# Patient Record
Sex: Female | Born: 1960 | Race: Black or African American | Hispanic: No | Marital: Single | Smoking: Never smoker
Health system: Southern US, Community
[De-identification: ages and names within clinical notes are randomized; demographics above are authoritative.]

## PROBLEM LIST (undated history)

## (undated) DIAGNOSIS — E079 Disorder of thyroid, unspecified: Secondary | ICD-10-CM

## (undated) DIAGNOSIS — I10 Essential (primary) hypertension: Secondary | ICD-10-CM

## (undated) HISTORY — DX: Disorder of thyroid, unspecified: E07.9

## (undated) HISTORY — DX: Essential (primary) hypertension: I10

## (undated) HISTORY — PX: TONSILLECTOMY: SUR1361

## (undated) HISTORY — PX: TOTAL ABDOMINAL HYSTERECTOMY: SHX209

## (undated) HISTORY — PX: ABDOMINAL HYSTERECTOMY: SHX81

---

## 2019-01-22 DIAGNOSIS — E039 Hypothyroidism, unspecified: Secondary | ICD-10-CM | POA: Insufficient documentation

## 2019-01-22 DIAGNOSIS — G4733 Obstructive sleep apnea (adult) (pediatric): Secondary | ICD-10-CM | POA: Insufficient documentation

## 2019-01-22 DIAGNOSIS — E7800 Pure hypercholesterolemia, unspecified: Secondary | ICD-10-CM | POA: Insufficient documentation

## 2019-01-22 DIAGNOSIS — Z86010 Personal history of colonic polyps: Secondary | ICD-10-CM | POA: Insufficient documentation

## 2019-01-22 DIAGNOSIS — R9230 Dense breasts, unspecified: Secondary | ICD-10-CM | POA: Insufficient documentation

## 2019-01-22 DIAGNOSIS — E669 Obesity, unspecified: Secondary | ICD-10-CM | POA: Insufficient documentation

## 2019-01-22 DIAGNOSIS — R600 Localized edema: Secondary | ICD-10-CM | POA: Insufficient documentation

## 2019-01-22 DIAGNOSIS — K5730 Diverticulosis of large intestine without perforation or abscess without bleeding: Secondary | ICD-10-CM | POA: Insufficient documentation

## 2019-01-22 DIAGNOSIS — D72819 Decreased white blood cell count, unspecified: Secondary | ICD-10-CM | POA: Insufficient documentation

## 2019-01-22 DIAGNOSIS — Z860101 Personal history of adenomatous and serrated colon polyps: Secondary | ICD-10-CM | POA: Insufficient documentation

## 2019-01-22 DIAGNOSIS — N951 Menopausal and female climacteric states: Secondary | ICD-10-CM | POA: Insufficient documentation

## 2019-01-22 DIAGNOSIS — I10 Essential (primary) hypertension: Secondary | ICD-10-CM | POA: Insufficient documentation

## 2020-03-26 ENCOUNTER — Other Ambulatory Visit: Payer: Self-pay

## 2020-03-26 DIAGNOSIS — Z20822 Contact with and (suspected) exposure to covid-19: Secondary | ICD-10-CM

## 2020-03-29 LAB — NOVEL CORONAVIRUS, NAA: SARS-CoV-2, NAA: DETECTED — AB

## 2021-03-28 ENCOUNTER — Other Ambulatory Visit: Payer: Self-pay | Admitting: Family Medicine

## 2021-03-28 DIAGNOSIS — Z1231 Encounter for screening mammogram for malignant neoplasm of breast: Secondary | ICD-10-CM

## 2021-04-16 ENCOUNTER — Ambulatory Visit
Admission: RE | Admit: 2021-04-16 | Discharge: 2021-04-16 | Disposition: A | Discharge status: Home / Self Care | Payer: 59 | Source: Ambulatory Visit | Attending: Family Medicine | Admitting: Family Medicine

## 2021-04-16 ENCOUNTER — Other Ambulatory Visit: Payer: Self-pay

## 2021-04-16 ENCOUNTER — Ambulatory Visit: Payer: Self-pay

## 2021-04-16 DIAGNOSIS — Z1231 Encounter for screening mammogram for malignant neoplasm of breast: Secondary | ICD-10-CM

## 2021-08-31 DIAGNOSIS — H0288B Meibomian gland dysfunction left eye, upper and lower eyelids: Secondary | ICD-10-CM | POA: Diagnosis not present

## 2021-08-31 DIAGNOSIS — H04123 Dry eye syndrome of bilateral lacrimal glands: Secondary | ICD-10-CM | POA: Diagnosis not present

## 2021-08-31 DIAGNOSIS — H40013 Open angle with borderline findings, low risk, bilateral: Secondary | ICD-10-CM | POA: Diagnosis not present

## 2021-08-31 DIAGNOSIS — H0288A Meibomian gland dysfunction right eye, upper and lower eyelids: Secondary | ICD-10-CM | POA: Diagnosis not present

## 2021-09-27 DIAGNOSIS — E039 Hypothyroidism, unspecified: Secondary | ICD-10-CM | POA: Diagnosis not present

## 2021-09-27 DIAGNOSIS — E669 Obesity, unspecified: Secondary | ICD-10-CM | POA: Diagnosis not present

## 2021-09-27 DIAGNOSIS — I10 Essential (primary) hypertension: Secondary | ICD-10-CM | POA: Diagnosis not present

## 2021-09-27 DIAGNOSIS — Z0000 Encounter for general adult medical examination without abnormal findings: Secondary | ICD-10-CM | POA: Diagnosis not present

## 2021-09-27 DIAGNOSIS — N951 Menopausal and female climacteric states: Secondary | ICD-10-CM | POA: Diagnosis not present

## 2021-10-19 DIAGNOSIS — I10 Essential (primary) hypertension: Secondary | ICD-10-CM | POA: Diagnosis not present

## 2021-10-19 DIAGNOSIS — E559 Vitamin D deficiency, unspecified: Secondary | ICD-10-CM | POA: Diagnosis not present

## 2021-10-19 DIAGNOSIS — E782 Mixed hyperlipidemia: Secondary | ICD-10-CM | POA: Diagnosis not present

## 2021-10-19 DIAGNOSIS — E039 Hypothyroidism, unspecified: Secondary | ICD-10-CM | POA: Diagnosis not present

## 2021-12-27 DIAGNOSIS — Z789 Other specified health status: Secondary | ICD-10-CM | POA: Diagnosis not present

## 2022-01-17 ENCOUNTER — Ambulatory Visit: Payer: 59 | Admitting: Family Medicine

## 2022-04-14 DIAGNOSIS — I10 Essential (primary) hypertension: Secondary | ICD-10-CM | POA: Diagnosis not present

## 2022-04-14 DIAGNOSIS — M5412 Radiculopathy, cervical region: Secondary | ICD-10-CM | POA: Diagnosis not present

## 2022-04-14 DIAGNOSIS — R0789 Other chest pain: Secondary | ICD-10-CM | POA: Diagnosis not present

## 2022-04-14 DIAGNOSIS — Z79899 Other long term (current) drug therapy: Secondary | ICD-10-CM | POA: Diagnosis not present

## 2022-04-14 DIAGNOSIS — M47812 Spondylosis without myelopathy or radiculopathy, cervical region: Secondary | ICD-10-CM | POA: Diagnosis not present

## 2022-04-14 DIAGNOSIS — R9431 Abnormal electrocardiogram [ECG] [EKG]: Secondary | ICD-10-CM | POA: Diagnosis not present

## 2022-04-14 DIAGNOSIS — R079 Chest pain, unspecified: Secondary | ICD-10-CM | POA: Diagnosis not present

## 2022-04-14 DIAGNOSIS — M4802 Spinal stenosis, cervical region: Secondary | ICD-10-CM | POA: Diagnosis not present

## 2022-04-14 DIAGNOSIS — R0602 Shortness of breath: Secondary | ICD-10-CM | POA: Diagnosis not present

## 2022-06-02 NOTE — Progress Notes (Signed)
Established patient visit   Patient: Mckenzie Mckinney   DOB: 1960-12-13   62 y.o. Female  MRN: [id number] Visit Date: 06/03/2022  Today's healthcare provider: Owens Loffler, DO   Chief Complaint  Patient presents with   [place]    Chief Complaint  Patient presents with   Establish Care   HPI  Pt presents for new patient visit.  She has multiple things she wants to discuss today Patient said in February she went to the ED for chest pain which she thinks may have been indigestion related.  She had a full workup including EKG and x-ray that was normal.  However, patient says that now she gets winded walking to and from the parking garage when she did not get winded in the past.  This has been ongoing for some months now.  She did have COVID in 2022.  She denies any recent weight gain.  She denies any wheezing or underlying lung disease to her knowledge.  She does say she has a sister that has CHF.  She also admits to swollen ankles when she wakes up.  Patient would like to talk about menopause.  She is getting hot flashes that are 10+ a day.  She did have a hysterectomy in 1989 but still has her ovaries.  Weight management Patient would like to discuss weight today.  She is overweight and says that she has been having trouble with diet and exercise.  She would like to do something today  Review of Systems  Constitutional:  Negative for activity change, fatigue and fever.  Respiratory:  Negative for cough and shortness of breath.   Cardiovascular:  Negative for chest pain.  Gastrointestinal:  Negative for abdominal pain.  Genitourinary:  Negative for difficulty urinating.       Current Meds  Medication Sig   cholecalciferol (VITAMIN D3) 25 MCG (1000 UNIT) tablet Take 1,000 Units by mouth daily.   levothyroxine (SYNTHROID) 100 MCG tablet Take 100 mcg by mouth. Daily T, W, F, Sat, Sun   levothyroxine (SYNTHROID) 88 MCG tablet Take 88 mcg by mouth. Mon,  Thurs   valsartan-hydrochlorothiazide (DIOVAN-HCT) 320-25 MG tablet Take 1 tablet by mouth daily.    OBJECTIVE    BP 106/70   Pulse 62   Ht 5' (1.524 m)   Wt 189 lb (85.7 kg)   SpO2 100%   BMI 36.91 kg/m   Physical Exam Vitals and nursing note reviewed.  Constitutional:      General: She is not in acute distress.    Appearance: Normal appearance.  HENT:     Head: Normocephalic and atraumatic.     Right Ear: External ear normal.     Left Ear: External ear normal.     Nose: Nose normal.  Eyes:     Conjunctiva/sclera: Conjunctivae normal.  Cardiovascular:     Rate and Rhythm: Normal rate and regular rhythm.  Pulmonary:     Effort: Pulmonary effort is normal.     Breath sounds: Normal breath sounds.  Neurological:     General: No focal deficit present.     Mental Status: She is alert and oriented to person, place, and time.  Psychiatric:        Mood and Affect: Mood normal.        Behavior: Behavior normal.        Thought Content: Thought content normal.        Judgment: Judgment normal.  ASSESSMENT & PLAN    Problem List Items Addressed This Visit       Other   Dyspnea - Primary    62 year old female presents to establish care and has concerns of dyspnea.  Have ordered pulmonary function test to see if she has any underlying lung disease.  I have also ordered echocardiogram to see if this is cardiac in nature.  With the swollen ankles and dyspnea I would like to rule out heart failure. -Ordering CBC to check for signs of anemia and CMP to check for any electrolyte abnormalities that could be the cause.      Relevant Orders   Pulmonary function test   ECHOCARDIOGRAM COMPLETE   CBC   COMPLETE METABOLIC PANEL WITH GFR   Class 2 obesity due to excess calories without serious comorbidity with body mass index (BMI) of 36.0 to 36.9 in adult    - Had a long discussion with patient about diet and exercise and we created a plan where she can start  incorporating exercise into her daily life.  She has decided she will walk 3 times a week.  She is also doing a whole hoop class this weekend to see if she likes that as well.  We discussed needing to enjoy exercise for her health -We will go ahead with lifestyle modifications for 3 months, and then I would like to discuss medication.  -Would like to get TSH, lipid panel      Relevant Orders   TSH + free T4   Lipid panel   Menopause    Have referred patient to gynecology to further discuss and she did have a hysterectomy and still has her ovaries so they can formulate a plan about possible hormone replacement therapy versus other medications.      Relevant Orders   Ambulatory referral to Obstetrics / Gynecology   Screening for lipid disorders   Relevant Orders   Lipid panel    Return in about 3 months (around 09/03/2022).      No orders of the defined types were placed in this encounter.   Orders Placed This Encounter  Procedures   TSH + free T4   Lipid panel   CBC   COMPLETE METABOLIC PANEL WITH GFR   Ambulatory referral to Obstetrics / Gynecology    Referral Priority:   Routine    Referral Type:   Consultation    Referral Reason:   Specialty Services Required    Requested Specialty:   Obstetrics and Gynecology   ECHOCARDIOGRAM COMPLETE    Standing Status:   Future    Standing Expiration Date:   06/03/2023    Order Specific Question:   Where should this test be performed    Answer:   [facility]    Order Specific Question:   Perflutren DEFINITY (image enhancing agent) should be administered unless hypersensitivity or allergy exist    Answer:   Administer Perflutren    Order Specific Question:   Reason for exam-Echo    Answer:   Dyspnea  R06.00   Pulmonary function test    Standing Status:   Future    Standing Expiration Date:   06/03/2023    Order Specific Question:   Where should this test be performed?    Answer:   Izell Bufalo, DO  [facility]  Health Primary Care & Sports Medicine at [facility] [phone] (phone) [phone] (fax)  [place]

## 2022-06-03 ENCOUNTER — Encounter: Payer: Self-pay | Admitting: Family Medicine

## 2022-06-03 ENCOUNTER — Ambulatory Visit: Payer: BC Managed Care – PPO | Admitting: Family Medicine

## 2022-06-03 VITALS — BP 106/70 | HR 62 | Ht 60.0 in | Wt 189.0 lb

## 2022-06-03 DIAGNOSIS — R0600 Dyspnea, unspecified: Secondary | ICD-10-CM

## 2022-06-03 DIAGNOSIS — Z780 Asymptomatic menopausal state: Secondary | ICD-10-CM | POA: Diagnosis not present

## 2022-06-03 DIAGNOSIS — Z6836 Body mass index (BMI) 36.0-36.9, adult: Secondary | ICD-10-CM

## 2022-06-03 DIAGNOSIS — Z13220 Encounter for screening for lipoid disorders: Secondary | ICD-10-CM

## 2022-06-03 DIAGNOSIS — E6609 Other obesity due to excess calories: Secondary | ICD-10-CM

## 2022-06-03 NOTE — Assessment & Plan Note (Signed)
Have referred patient to gynecology to further discuss and she did have a hysterectomy and still has her ovaries so they can formulate a plan about possible hormone replacement therapy versus other medications.

## 2022-06-03 NOTE — Assessment & Plan Note (Addendum)
-   Had a long discussion with patient about diet and exercise and we created a plan where she can start incorporating exercise into her daily life.  She has decided she will walk 3 times a week.  She is also doing a whole hoop class this weekend to see if she likes that as well.  We discussed needing to enjoy exercise for her health -We will go ahead with lifestyle modifications for 3 months, and then I would like to discuss medication.  -Would like to get TSH, lipid panel

## 2022-06-03 NOTE — Assessment & Plan Note (Addendum)
62 year old female presents to establish care and has concerns of dyspnea.  Have ordered pulmonary function test to see if she has any underlying lung disease.  I have also ordered echocardiogram to see if this is cardiac in nature.  With the swollen ankles and dyspnea I would like to rule out heart failure. -Ordering CBC to check for signs of anemia and CMP to check for any electrolyte abnormalities that could be the cause.

## 2022-06-04 LAB — COMPLETE METABOLIC PANEL WITH GFR
AG Ratio: 2.2 (calc) (ref 1.0–2.5)
ALT: 13 U/L (ref 6–29)
AST: 16 U/L (ref 10–35)
Albumin: 5.1 g/dL (ref 3.6–5.1)
Alkaline phosphatase (APISO): 71 U/L (ref 37–153)
BUN: 18 mg/dL (ref 7–25)
CO2: 26 mmol/L (ref 20–32)
Calcium: 10.2 mg/dL (ref 8.6–10.4)
Chloride: 105 mmol/L (ref 98–110)
Creat: 0.97 mg/dL (ref 0.50–1.05)
Globulin: 2.3 g/dL (calc) (ref 1.9–3.7)
Glucose, Bld: 87 mg/dL (ref 65–99)
Potassium: 3.8 mmol/L (ref 3.5–5.3)
Sodium: 142 mmol/L (ref 135–146)
Total Bilirubin: 0.5 mg/dL (ref 0.2–1.2)
Total Protein: 7.4 g/dL (ref 6.1–8.1)
eGFR: 67 mL/min/{1.73_m2} (ref >=60)

## 2022-06-04 LAB — CBC
HCT: 43.4 % (ref 35.0–45.0)
Hemoglobin: 14.8 g/dL (ref 11.7–15.5)
MCH: 29 pg (ref 27.0–33.0)
MCHC: 34.1 g/dL (ref 32.0–36.0)
MCV: 84.9 fL (ref 80.0–100.0)
MPV: 11.8 fL (ref 7.5–12.5)
Platelets: 227 10*3/uL (ref 140–400)
RBC: 5.11 10*6/uL — ABNORMAL HIGH (ref 3.80–5.10)
RDW: 12.9 % (ref 11.0–15.0)
WBC: 3.6 10*3/uL — ABNORMAL LOW (ref 3.8–10.8)

## 2022-06-04 LAB — LIPID PANEL
Cholesterol: 214 mg/dL — ABNORMAL HIGH (ref <200)
HDL: 65 mg/dL (ref >=50)
LDL Cholesterol (Calc): 130 mg/dL (calc) — ABNORMAL HIGH
Non-HDL Cholesterol (Calc): 149 mg/dL (calc) — ABNORMAL HIGH (ref <130)
Total CHOL/HDL Ratio: 3.3 (calc) (ref <5.0)
Triglycerides: 91 mg/dL (ref <150)

## 2022-06-04 LAB — TSH+FREE T4: TSH W/REFLEX TO FT4: 2.1 mIU/L (ref 0.40–4.50)

## 2022-06-13 ENCOUNTER — Telehealth (HOSPITAL_BASED_OUTPATIENT_CLINIC_OR_DEPARTMENT_OTHER): Payer: Self-pay | Admitting: *Deleted

## 2022-06-13 NOTE — Telephone Encounter (Signed)
Left message for patient to call and schedule the Echocardiogram ordered by Dr. Mel Almond

## 2022-06-20 ENCOUNTER — Other Ambulatory Visit: Payer: Self-pay | Admitting: Family Medicine

## 2022-07-06 ENCOUNTER — Encounter: Payer: Self-pay | Admitting: Obstetrics and Gynecology

## 2022-07-06 ENCOUNTER — Ambulatory Visit: Payer: BC Managed Care – PPO | Admitting: Obstetrics and Gynecology

## 2022-07-06 VITALS — BP 111/64 | HR 64 | Ht 60.0 in | Wt 190.0 lb

## 2022-07-06 DIAGNOSIS — N951 Menopausal and female climacteric states: Secondary | ICD-10-CM

## 2022-07-06 NOTE — Progress Notes (Signed)
NEW GYNECOLOGY VISIT  Subjective:  Mckenzie Mckinney is a 62 y.o. G2P2 with history of hysterectomy (~1990) for AUB presenting for discussion of hot flashes.   Reports severe hot flashes occurring multiple times per day for the past 20 years. Is able to sleep. Would like to know more about non-estrogen options for treatment. Has hx HTN, sister w/ breast ca  PapHx: Hysterectomy for benign indications > 30 years ago, paps not indicated  Past Medical History:  Diagnosis Date   Hypertension    Thyroid disease    Past Surgical History:  Procedure Laterality Date   ABDOMINAL HYSTERECTOMY     CESAREAN SECTION     x 2   TONSILLECTOMY     Current Outpatient Medications on File Prior to Visit  Medication Sig Dispense Refill   cholecalciferol (VITAMIN D3) 25 MCG (1000 UNIT) tablet Take 1,000 Units by mouth daily.     levothyroxine (SYNTHROID) 100 MCG tablet Take 100 mcg by mouth. Daily T, W, F, Sat, Sun     levothyroxine (SYNTHROID) 88 MCG tablet Take 88 mcg by mouth. Mon, Thurs     valsartan-hydrochlorothiazide (DIOVAN-HCT) 320-25 MG tablet TAKE 1 TABLET BY MOUTH DAILY 90 tablet 0   No current facility-administered medications on file prior to visit.   No Known Allergies OB History     Gravida  2   Para  2   Term  2   Preterm      AB      Living  2      SAB      IAB      Ectopic      Multiple      Live Births             Social History   Socioeconomic History   Marital status: Single    Spouse name: Not on file   Number of children: Not on file   Years of education: Not on file   Highest education level: Not on file  Occupational History   Not on file  Tobacco Use   Smoking status: Never   Smokeless tobacco: Never  Vaping Use   Vaping Use: Never used  Substance and Sexual Activity   Alcohol use: Not on file   Drug use: Never   Sexual activity: Not Currently    Partners: Male    Birth control/protection: Abstinence  Other Topics Concern    Not on file  Social History Narrative   Not on file   Social Determinants of Health   Financial Resource Strain: Not on file  Food Insecurity: Not on file  Transportation Needs: Not on file  Physical Activity: Not on file  Stress: Not on file  Social Connections: Not on file  Intimate Partner Violence: Not on file    Objective:   Vitals:   07/06/22 1525  BP: 111/64  Pulse: 64  Weight: 190 lb (86.2 kg)  Height: 5' (1.524 m)    General:  Alert, oriented and cooperative. Patient is in no acute distress.  Skin: Skin is warm and dry. No rash noted.   Cardiovascular: Normal heart rate noted  Respiratory: Normal respiratory effort, no problems with respiration noted  Abdomen: Soft, non-tender, non-distended   Pelvic: NEFG.   Exam performed in the presence of a chaperone  Assessment and Plan:  Mckenzie Mckinney is a 62 y.o. with hot flashes due to menopause and desire for non-hormonal treatment.   Hot flashes due to menopause -  We discussed that hormone therapy is the most efficacious treatment for hot flashes, but carries risk. - We discussed that risk appears to be highest in women aged 60+ and in women who start hormone therapy several years after menopause. It is difficult to know her distance from menopause (hx hyst), but if we assume average age, she would be ~9 years from menopause and fall into this higher risk category - In light of these risks, it is understandable that she would like to avoid estrogen based therapies.  - We discussed non-hormonal options with first line being SSRIs/SNRIs  -- Reviewed that it can take a few days to weeks to see the effect  -- Paxil (paroxetine) is the only FDA-approved anti-depressant for the treatment of hot flashes. There is a low dose option (7.5mg /d), but insurance coverage is limited.  -- Citalopram, escitalopram, venlafaxine, and desvenlafaxine are other options though venlafaxine & desvenlafaxine tend to have more side effects  (nausea/vomiting) -- Gabapentin is an option for women who don't tolerate SSRI/SNRI's and seem to help women whose hot flashes are worst at night. Side effects include headache, dizziness, and drowsiness  -- Fezolinetant (Veoz[place]) - neurokinin 3 receptor (NK3R) antagonist that works at the level of the hypothalamus. This is not an antidepressant. There can be rare liver issue related to the medicine, so the FDA recommends getting liver enzymes before starting treatment and every 3 months for the first 9 months of treatment.  - We discussed the possibility of using a progesterone-only method for treatment including aygestin  daily or micronized progesterone  daily. There are no long term studies that look at safety of long term use of progesterone-only treatments for hot flashes.  After discussion, [name] would like to consider her options.   I spent a total of 45 minutes reviewing the patient's chart, interviewing & counseling the patient and documenting our encounter.   Future Appointments  Date Time Provider Department Center  07/12/2022  2:00 PM [facility]VAS [facility]  08/03/2022  4:00 PM Charlton Amor, DO [facility] None   Lennart Pall, MD

## 2022-07-12 ENCOUNTER — Other Ambulatory Visit: Payer: Self-pay | Admitting: Family Medicine

## 2022-07-12 ENCOUNTER — Ambulatory Visit (INDEPENDENT_AMBULATORY_CARE_PROVIDER_SITE_OTHER): Payer: BC Managed Care – PPO

## 2022-07-12 DIAGNOSIS — R0600 Dyspnea, unspecified: Secondary | ICD-10-CM

## 2022-07-12 LAB — ECHOCARDIOGRAM COMPLETE
AR max vel: 1.58 cm2
AV Area VTI: 1.68 cm2
AV Area mean vel: 1.57 cm2
AV Mean grad: 6 mmHg
AV Peak grad: 12.4 mmHg
Ao pk vel: 1.76 m/s
Area-P 1/2: 3.31 cm2
P 1/2 time: 917 msec
S' Lateral: 1.97 cm

## 2022-07-26 ENCOUNTER — Other Ambulatory Visit: Payer: Self-pay

## 2022-07-26 MED ORDER — LEVOTHYROXINE SODIUM 100 MCG PO TABS
100.0000 ug | ORAL_TABLET | ORAL | 3 refills | Status: DC
  Filled 2022-07-26: qty 30, fill #0

## 2022-08-03 ENCOUNTER — Encounter: Payer: Self-pay | Admitting: Family Medicine

## 2022-08-03 ENCOUNTER — Ambulatory Visit: Payer: BC Managed Care – PPO | Admitting: Family Medicine

## 2022-08-03 VITALS — BP 115/75 | HR 63 | Resp 20 | Ht 60.0 in | Wt 188.3 lb

## 2022-08-03 DIAGNOSIS — E039 Hypothyroidism, unspecified: Secondary | ICD-10-CM | POA: Diagnosis not present

## 2022-08-03 DIAGNOSIS — R0600 Dyspnea, unspecified: Secondary | ICD-10-CM | POA: Diagnosis not present

## 2022-08-03 NOTE — Patient Instructions (Signed)
Stop levothyroxine   Start taking levothyroxine  Follow up in 6 weeks

## 2022-08-03 NOTE — Progress Notes (Signed)
Established patient visit   Patient: Mckenzie Mckinney   DOB: [birth year]   61 y.o. Female  MRN: [id number] Visit Date: 08/03/2022  Today's healthcare provider: Charlton Amor, DO   Chief Complaint  Patient presents with   Follow-up   Shortness of Breath    SUBJECTIVE    Chief Complaint  Patient presents with   Follow-up   Shortness of Breath   HPI  Pt presents for follow up on dyspnea. At last visit, echo and PFT were ordered. Echo was normal. She has been walking more recently and has lost a few pounds. She has noted a decrease in her dyspnea.   Levothyroxine She is taking levothyroxine two days a week and then the rest of the days of the week.  Review of Systems  Constitutional:  Negative for activity change, fatigue and fever.  Respiratory:  Negative for cough and shortness of breath.   Cardiovascular:  Negative for chest pain.  Gastrointestinal:  Negative for abdominal pain.  Genitourinary:  Negative for difficulty urinating.       Current Meds  Medication Sig   cholecalciferol (VITAMIN D3) 25 MCG (1000 UNIT) tablet Take 1,000 Units by mouth daily.   levothyroxine (SYNTHROID) 100 MCG tablet Take 1 tablet (100 mcg total) by mouth on Tuesday, Wednesday, Friday, Saturday and Sunday.   levothyroxine (SYNTHROID) 88 MCG tablet Take 88 mcg by mouth. Mon, Thurs   valsartan-hydrochlorothiazide (DIOVAN-HCT) 320-25 MG tablet TAKE 1 TABLET BY MOUTH DAILY    OBJECTIVE    BP 115/75 (BP Location: Right Arm, Cuff Size: Normal)   Pulse 63   Resp 20   Ht 5' (1.524 m)   Wt 188 lb 4.8 oz (85.4 kg)   SpO2 99%   BMI 36.77 kg/m   Physical Exam Vitals and nursing note reviewed.  Constitutional:      General: She is not in acute distress.    Appearance: Normal appearance.  HENT:     Head: Normocephalic and atraumatic.     Right Ear: External ear normal.     Left Ear: External ear normal.     Nose: Nose normal.  Eyes:     Conjunctiva/sclera: Conjunctivae  normal.  Cardiovascular:     Rate and Rhythm: Normal rate and regular rhythm.  Pulmonary:     Effort: Pulmonary effort is normal.     Breath sounds: Normal breath sounds.  Neurological:     General: No focal deficit present.     Mental Status: She is alert and oriented to person, place, and time.  Psychiatric:        Mood and Affect: Mood normal.        Behavior: Behavior normal.        Thought Content: Thought content normal.        Judgment: Judgment normal.          ASSESSMENT & PLAN    Problem List Items Addressed This Visit       Endocrine   Hypothyroidism - Primary    - Pt on levothyroxine and . Discussed taking for 6 weeks and then getting a TSH         Other   Dyspnea    - resolved, continue diet and exercise       At next visit will get TSH level and dose adjust as needed Will also get vit d levels   Return in about 6 weeks (around 09/14/2022).  No orders of the defined types were placed in this encounter.   No orders of the defined types were placed in this encounter.    Charlton Amor, DO  [facility] Primary Care & Sports Medicine at [facility] [phone] (phone) [phone] (fax)  [facility]

## 2022-08-03 NOTE — Assessment & Plan Note (Signed)
-   Pt on levothyroxine and . Discussed taking for 6 weeks and then getting a TSH

## 2022-08-03 NOTE — Assessment & Plan Note (Signed)
-   resolved, continue diet and exercise

## 2022-08-25 ENCOUNTER — Other Ambulatory Visit: Payer: Self-pay | Admitting: Family Medicine

## 2022-08-25 MED ORDER — LEVOTHYROXINE SODIUM 88 MCG PO TABS: 88.0000 ug | ORAL_TABLET | Freq: Every day | ORAL | 0 refills | Status: DC

## 2022-08-25 NOTE — Telephone Encounter (Signed)
Patient called requesting a refill of levothyroxine (SYNTHROID) 88 MCG tablet [[id number]]   Pharmacy told the patient they need clarification on whether she is taking the 100 mcg or the 88 mcg before it can be refilled.  Pharmacy -  [facility] [phone] [place], [place] - [address] AT [facility] [address]

## 2022-08-25 NOTE — Telephone Encounter (Signed)
Spoke with [employer] and was told that she would need a new prescription for the to be taken daily- the current prescription is from a previous prescriber and only written to take on Monday and Tuesday. Pended the script for daily administration.

## 2022-08-29 ENCOUNTER — Other Ambulatory Visit: Payer: Self-pay

## 2022-08-29 MED ORDER — LEVOTHYROXINE SODIUM 88 MCG PO TABS: 88.0000 ug | ORAL_TABLET | Freq: Every day | ORAL | 0 refills | Status: DC

## 2022-09-14 ENCOUNTER — Encounter: Payer: Self-pay | Admitting: Family Medicine

## 2022-09-14 ENCOUNTER — Ambulatory Visit: Payer: BC Managed Care – PPO | Admitting: Family Medicine

## 2022-09-14 VITALS — BP 98/66 | HR 66 | Ht 60.0 in | Wt 191.0 lb

## 2022-09-14 DIAGNOSIS — E039 Hypothyroidism, unspecified: Secondary | ICD-10-CM | POA: Diagnosis not present

## 2022-09-14 DIAGNOSIS — I10 Essential (primary) hypertension: Secondary | ICD-10-CM

## 2022-09-14 NOTE — Assessment & Plan Note (Signed)
-   follow up TSH today to make sure is adequate dosing

## 2022-09-14 NOTE — Assessment & Plan Note (Signed)
Bp hypotensive today 98/66 will stop htn meds and follow up in 2 weeks

## 2022-09-14 NOTE — Patient Instructions (Signed)
Stop blood pressure medicine   Follow up in 2 weeks for nurse visit

## 2022-09-14 NOTE — Progress Notes (Signed)
Established patient visit   Patient: Mckenzie Mckinney   DOB: April 26, 1960   61 y.o. Female  MRN: [id number] Visit Date: 09/14/2022  Today's healthcare provider: Charlton Amor, DO   Chief Complaint  Patient presents with   Hypothyroidism    Follow up.    SUBJECTIVE    Chief Complaint  Patient presents with   Hypothyroidism    Follow up.   HPI HPI     Hypothyroidism    Additional comments: Follow up.      Last edited by Carren Rang, CMA on 09/14/2022  3:33 PM.      Pt presents for follow up on levothyroxine . She is here for repeat for TSH levels.  Review of Systems  Constitutional:  Negative for activity change, fatigue and fever.  Respiratory:  Negative for cough and shortness of breath.   Cardiovascular:  Negative for chest pain.  Gastrointestinal:  Negative for abdominal pain.  Genitourinary:  Negative for difficulty urinating.       Current Meds  Medication Sig   cholecalciferol (VITAMIN D3) 25 MCG (1000 UNIT) tablet Take 1,000 Units by mouth daily.   levothyroxine (SYNTHROID) 88 MCG tablet Take 1 tablet (88 mcg total) by mouth daily.   [DISCONTINUED] valsartan-hydrochlorothiazide (DIOVAN-HCT) 320-25 MG tablet TAKE 1 TABLET BY MOUTH DAILY    OBJECTIVE    BP 98/66   Pulse 66   Ht 5' (1.524 m)   Wt 191 lb (86.6 kg)   SpO2 98%   BMI 37.30 kg/m   Physical Exam Vitals and nursing note reviewed.  Constitutional:      General: She is not in acute distress.    Appearance: Normal appearance.  HENT:     Head: Normocephalic and atraumatic.     Right Ear: External ear normal.     Left Ear: External ear normal.     Nose: Nose normal.  Eyes:     Conjunctiva/sclera: Conjunctivae normal.  Cardiovascular:     Rate and Rhythm: Normal rate and regular rhythm.  Pulmonary:     Effort: Pulmonary effort is normal.     Breath sounds: Normal breath sounds.  Neurological:     General: No focal deficit present.     Mental Status: She is alert and  oriented to person, place, and time.  Psychiatric:        Mood and Affect: Mood normal.        Behavior: Behavior normal.        Thought Content: Thought content normal.        Judgment: Judgment normal.          ASSESSMENT & PLAN    Problem List Items Addressed This Visit       Cardiovascular and Mediastinum   Essential hypertension    Bp hypotensive today 98/66 will stop htn meds and follow up in 2 weeks        Endocrine   Hypothyroidism - Primary    - follow up TSH today to make sure is adequate dosing      Relevant Orders   TSH    Return in about 2 weeks (around 09/28/2022) for htn check nurse visit.      No orders of the defined types were placed in this encounter.   Orders Placed This Encounter  Procedures   TSH     Charlton Amor, DO  [facility] Primary Care & Sports Medicine at [facility] [phone] (phone) [phone] (fax)  [facility]

## 2022-09-15 LAB — TSH: TSH: 2.15 mIU/L (ref 0.40–4.50)

## 2022-09-17 ENCOUNTER — Other Ambulatory Visit: Payer: Self-pay | Admitting: Family Medicine

## 2022-09-28 ENCOUNTER — Ambulatory Visit (INDEPENDENT_AMBULATORY_CARE_PROVIDER_SITE_OTHER): Payer: BC Managed Care – PPO | Admitting: Family Medicine

## 2022-09-28 VITALS — BP 183/85 | HR 65 | Resp 20

## 2022-09-28 DIAGNOSIS — I10 Essential (primary) hypertension: Secondary | ICD-10-CM | POA: Diagnosis not present

## 2022-09-28 MED ORDER — VALSARTAN-HYDROCHLOROTHIAZIDE 320-25 MG PO TABS
0.5000 | ORAL_TABLET | Freq: Every day | ORAL | 2 refills | Status: DC
Start: 2022-09-28 — End: 2022-10-03

## 2022-09-28 NOTE — Progress Notes (Signed)
   Subjective:    Patient ID: Mckenzie Mckinney, female    DOB: 04-04-1960, 62 y.o.   MRN: [id number]  HPI Patient is here for blood pressure check. Patient complains of ankles swelling since she was taken off of her medication.   Review of Systems     Objective:   Physical Exam        Assessment & Plan:   PCP spoke with patient and is sending medication into the pharmacy on file. Advised to follow up with her PCP in 3 months and to monitor blood pressure at home.

## 2022-09-28 NOTE — Progress Notes (Signed)
Patient ID: Mckenzie Mckinney, female   DOB: 11/15/1960, 62 y.o.   MRN: [id number]  Htn uncontrolled. Will restart patient on medicine  Have her follow up her blood pressure I went in and spoke to patient and examined her

## 2022-10-03 ENCOUNTER — Other Ambulatory Visit: Payer: Self-pay | Admitting: Family Medicine

## 2022-10-03 DIAGNOSIS — I10 Essential (primary) hypertension: Secondary | ICD-10-CM

## 2022-10-03 MED ORDER — VALSARTAN-HYDROCHLOROTHIAZIDE 320-25 MG PO TABS
1.0000 | ORAL_TABLET | Freq: Every day | ORAL | 2 refills | Status: DC
Start: 2022-10-03 — End: 2022-12-01

## 2022-10-03 NOTE — Telephone Encounter (Signed)
Pt called. She states pharmacy gave her a 45 day supply of her Valsartan when she usually gets 90 days. Pharmacy told her she is to cut pill in half.  Please confirm.

## 2022-12-01 ENCOUNTER — Encounter: Payer: Self-pay | Admitting: Family Medicine

## 2022-12-01 ENCOUNTER — Ambulatory Visit: Payer: BC Managed Care – PPO | Admitting: Family Medicine

## 2022-12-01 ENCOUNTER — Ambulatory Visit (INDEPENDENT_AMBULATORY_CARE_PROVIDER_SITE_OTHER): Payer: BC Managed Care – PPO

## 2022-12-01 VITALS — BP 110/74 | HR 66 | Ht 60.0 in | Wt 188.4 lb

## 2022-12-01 DIAGNOSIS — M79661 Pain in right lower leg: Secondary | ICD-10-CM | POA: Diagnosis not present

## 2022-12-01 DIAGNOSIS — I10 Essential (primary) hypertension: Secondary | ICD-10-CM | POA: Diagnosis not present

## 2022-12-01 MED ORDER — VALSARTAN 80 MG PO TABS
80.0000 mg | ORAL_TABLET | Freq: Every day | ORAL | 3 refills | Status: DC
Start: 2022-12-01 — End: 2022-12-30

## 2022-12-01 NOTE — Addendum Note (Signed)
Addended by: Charlton Amor on: 12/01/2022 03:30 PM   Modules accepted: Level of Service

## 2022-12-01 NOTE — Assessment & Plan Note (Signed)
Blood pressure on the lower end and patient does have symptoms will go ahead and change bp meds  - will follow up at next visit to check htn and get blood work

## 2022-12-01 NOTE — Assessment & Plan Note (Signed)
-   pt has right calf pain, will get doppler US to rule out DVT - have also sent referral to vascular to see if this is a claudication issue  - discussed possible anemia issue and will get cbc and ferritin levels at next visit.

## 2022-12-01 NOTE — Progress Notes (Signed)
Acute Office Visit  Subjective:     Patient ID: Mckenzie Mckinney, female    DOB: 08-18-1960, 62 y.o.   MRN: [id number]  No chief complaint on file.   HPI Patient is in today for R lower leg pain. Pain has been intermittent for the last 4 months however last night it started to flare up very bad and she was unable to sleep.  HTN - bp today is 110/74 and she is taking half of valsartan-hydrochlorothiazide 320-25mg   Has a hx of anemia  Review of Systems  Constitutional:  Negative for chills and fever.  Respiratory:  Negative for cough and shortness of breath.   Cardiovascular:  Negative for chest pain.  Musculoskeletal:        Right calf pain  Neurological:  Negative for headaches.        Objective:    BP 110/74 (BP Location: Left Arm, Patient Position: Sitting, Cuff Size: Normal)   Pulse 66   Ht 5' (1.524 m)   Wt 188 lb 7 oz (85.5 kg)   SpO2 97%   BMI 36.80 kg/m    Physical Exam Vitals and nursing note reviewed.  Constitutional:      General: She is not in acute distress.    Appearance: Normal appearance.  HENT:     Head: Normocephalic and atraumatic.     Right Ear: External ear normal.     Left Ear: External ear normal.     Nose: Nose normal.  Eyes:     Conjunctiva/sclera: Conjunctivae normal.  Cardiovascular:     Rate and Rhythm: Normal rate and regular rhythm.  Pulmonary:     Effort: Pulmonary effort is normal.     Breath sounds: Normal breath sounds.  Musculoskeletal:     Comments: R calf tenderness to palpation  Neurological:     General: No focal deficit present.     Mental Status: She is alert and oriented to person, place, and time.  Psychiatric:        Mood and Affect: Mood normal.        Behavior: Behavior normal.        Thought Content: Thought content normal.        Judgment: Judgment normal.     No results found for any visits on 12/01/22.      Assessment & Plan:   Problem List Items Addressed This Visit       Cardiovascular  and Mediastinum   Essential hypertension    Blood pressure on the lower end and patient does have symptoms will go ahead and change bp meds  - will follow up at next visit to check htn and get blood work       Relevant Medications   valsartan (DIOVAN) 80 MG tablet     Other   Right calf pain - Primary    - pt has right calf pain, will get doppler US to rule out DVT - have also sent referral to vascular to see if this is a claudication issue  - discussed possible anemia issue and will get cbc and ferritin levels at next visit.       Relevant Orders   US Venous Img Lower Unilateral Right (DVT)   Ambulatory referral to Vascular Surgery    Meds ordered this encounter  Medications   valsartan (DIOVAN) 80 MG tablet    Sig: Take 1 tablet (80 mg total) by mouth daily.    Dispense:  30 tablet    Refill:  3    Return next scheduled visit.  Charlton Amor, DO

## 2022-12-06 ENCOUNTER — Other Ambulatory Visit: Payer: Self-pay | Admitting: *Deleted

## 2022-12-06 DIAGNOSIS — M79661 Pain in right lower leg: Secondary | ICD-10-CM

## 2022-12-09 ENCOUNTER — Ambulatory Visit (HOSPITAL_COMMUNITY)
Admission: RE | Admit: 2022-12-09 | Discharge: 2022-12-09 | Disposition: A | Discharge status: Home / Self Care | Payer: BC Managed Care – PPO | Source: Ambulatory Visit | Attending: Vascular Surgery | Admitting: Vascular Surgery

## 2022-12-09 ENCOUNTER — Ambulatory Visit: Payer: BC Managed Care – PPO | Admitting: Vascular Surgery

## 2022-12-09 VITALS — BP 136/91 | HR 59 | Temp 97.6°F | Ht 60.0 in | Wt 192.2 lb

## 2022-12-09 DIAGNOSIS — M79661 Pain in right lower leg: Secondary | ICD-10-CM | POA: Diagnosis not present

## 2022-12-09 DIAGNOSIS — I872 Venous insufficiency (chronic) (peripheral): Secondary | ICD-10-CM | POA: Diagnosis not present

## 2022-12-09 DIAGNOSIS — I10 Essential (primary) hypertension: Secondary | ICD-10-CM

## 2022-12-09 LAB — VAS US ABI WITH/WO TBI
Left ABI: 1.02
Right ABI: 0.99

## 2022-12-09 NOTE — Progress Notes (Signed)
Patient ID: Mckenzie Mckinney, female   DOB: July 02, 1960, 62 y.o.   MRN: [id number]  Reason for Consult: Claudication   Referred by Charlton Amor, DO  Subjective:     HPI  Mckenzie Mckinney is a 62 y.o. female with a history of hypothyroidism and hypertension who presents for lower extremity pain she reports her pain as aching, right greater than left and worse at the end of the day.  She denies varicosities or previous ulceration.  She denies previous DVT.  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  She does report worse swelling towards the end of the day that improves with elevation.  She has never worn compression stockings.  She denies claudication, rest pain or nonhealing wounds. Of note she is reporting some shortness of breath and chest tightness with exertion as well as with lying flat.  Past Medical History:  Diagnosis Date   Hypertension    Thyroid disease    Family History  Problem Relation Age of Onset   Alzheimer's disease Father    Diabetes Mellitus II Father    Breast cancer Sister 30   Heart failure Sister    Breast cancer Paternal Aunt    Breast cancer Paternal Aunt    Breast cancer Paternal Aunt    Past Surgical History:  Procedure Laterality Date   ABDOMINAL HYSTERECTOMY     CESAREAN SECTION     x 2   TONSILLECTOMY     TOTAL ABDOMINAL HYSTERECTOMY      Short Social History:  Social History   Tobacco Use   Smoking status: Never   Smokeless tobacco: Never  Substance Use Topics   Alcohol use: Not on file    No Known Allergies  Current Outpatient Medications  Medication Sig Dispense Refill   levothyroxine (SYNTHROID) 88 MCG tablet Take 1 tablet (88 mcg total) by mouth daily. 90 tablet 0   valsartan (DIOVAN) 80 MG tablet Take 1 tablet (80 mg total) by mouth daily. 30 tablet 3   cholecalciferol (VITAMIN D3) 25 MCG (1000 UNIT) tablet Take 1,000 Units by mouth daily.     No current facility-administered  medications for this visit.    Review of Systems  All other systems reviewed and are negative       Objective:  Objective   Vitals:   12/09/22 0914  BP: (!) 136/91  Pulse: (!) 59  Temp: 97.6 F (36.4 C)  TempSrc: Temporal  SpO2: 97%  Weight: 192 lb 3.2 oz (87.2 kg)  Height: 5' (1.524 m)   Body mass index is 37.54 kg/m.  Physical Exam General: no acute distress Cardiac: hemodynamically stable, nontachycardic Pulm: normal work of breathing GI: non-tender, no pulsatile mass  Neuro: alert, no focal deficit Extremities: Minimal edema of bilateral lower extremities from ankle to knee, no varicosities, no evidence of previous ulcers Vascular: Palpable DPs bilaterally   Data: ABI performed today reviewed. 1.0 with toe pressures greater than 100 bilaterally  Echo reviewed which demonstrates an EF of 63%, no valvular dysfunction and no evidence of pulmonary hypertension.     Assessment/Plan:     Mckenzie Mckinney is a 62 y.o. female presenting for bilateral lower extremity edema and aching. We discussed the natural history of chronic venous insufficiency and the foundation of treatment which is compression elevation. Encouraged use of compression stockings and periodic elevation Encouraged to discuss a cardiology referral with her PCP given her  orthopnea and tightness with exertion although had a normal echo in April of this year.  She will trial compression and elevation and call back should her symptoms worsen.     Daria Pastures MD [facility]

## 2022-12-29 ENCOUNTER — Ambulatory Visit: Payer: BC Managed Care – PPO | Admitting: Family Medicine

## 2022-12-29 NOTE — Progress Notes (Signed)
Established patient visit   Patient: Mckenzie Mckinney   DOB: 1961/02/02   62 y.o. Female  MRN: [id number] Visit Date: 12/30/2022  Today's healthcare provider: Charlton Amor, DO   Chief Complaint  Patient presents with   Hypertension    SUBJECTIVE    Chief Complaint  Patient presents with   Hypertension   HPI  Pt presents for HTN follow up.  - bp was decreased at last visit due to hypotension - valsartan 80mg   - BP elevated to 160/88 - denies any chest pain, shortness of breath, headaches   Has some numbness along her right shin. Did see vascular surgery and likely pt has chronic venous insufficiency that could be causing this. Recommended compression stockings  Review of Systems  Constitutional:  Negative for activity change, fatigue and fever.  Respiratory:  Negative for cough and shortness of breath.   Cardiovascular:  Negative for chest pain.  Gastrointestinal:  Negative for abdominal pain.  Genitourinary:  Negative for difficulty urinating.       Current Meds  Medication Sig   levothyroxine (SYNTHROID) 88 MCG tablet Take 1 tablet (88 mcg total) by mouth daily.   valsartan (DIOVAN) 160 MG tablet Take 1 tablet (160 mg total) by mouth daily.   [DISCONTINUED] valsartan (DIOVAN) 80 MG tablet Take 1 tablet (80 mg total) by mouth daily.    OBJECTIVE    BP (!) 160/88 (BP Location: Left Arm, Patient Position: Sitting, Cuff Size: Large)   Pulse 63   Ht 5' (1.524 m)   Wt 192 lb 4 oz (87.2 kg)   SpO2 100%   BMI 37.55 kg/m   Physical Exam Vitals and nursing note reviewed.  Constitutional:      General: She is not in acute distress.    Appearance: Normal appearance.  HENT:     Head: Normocephalic and atraumatic.     Right Ear: External ear normal.     Left Ear: External ear normal.     Nose: Nose normal.  Eyes:     Conjunctiva/sclera: Conjunctivae normal.  Cardiovascular:     Rate and Rhythm: Normal rate and regular rhythm.  Pulmonary:     Effort:  Pulmonary effort is normal.     Breath sounds: Normal breath sounds.  Musculoskeletal:     Comments: Numbness to palpation of mid shin on the right leg about 4 cm long  Neurological:     General: No focal deficit present.     Mental Status: She is alert and oriented to person, place, and time.  Psychiatric:        Mood and Affect: Mood normal.        Behavior: Behavior normal.        Thought Content: Thought content normal.        Judgment: Judgment normal.        ASSESSMENT & PLAN    Problem List Items Addressed This Visit       Cardiovascular and Mediastinum   Essential hypertension - Primary    Bp uncontrolled, will increase valsartan to 160mg  - follow up in 2 weeks for HTN follow up      Relevant Medications   valsartan (DIOVAN) 160 MG tablet   Other Relevant Orders   CMP14+EGFR   Lipid panel     Endocrine   Hypothyroidism    Will repeat TSH levels today      Relevant Orders   TSH + free T4     Other  Numbness    Pt having some numbness of shin. Seen by vascular likely some venous insufficiency. Reiterated importance of wearing compression stockings and she will try this.  We will follow in 2 weeks       Return in about 2 weeks (around 01/13/2023) for htn.      Meds ordered this encounter  Medications   valsartan (DIOVAN) 160 MG tablet    Sig: Take 1 tablet (160 mg total) by mouth daily.    Dispense:  30 tablet    Refill:  3    Orders Placed This Encounter  Procedures   CMP14+EGFR    Order Specific Question:   Has the patient fasted?    Answer:   No   Lipid panel    Order Specific Question:   Has the patient fasted?    Answer:   No    Order Specific Question:   Release to patient    Answer:   Immediate   TSH + free T4     Charlton Amor, DO  [facility] Medicine at [facility] [phone] (phone) [phone] (fax)  [facility]

## 2022-12-30 ENCOUNTER — Ambulatory Visit (INDEPENDENT_AMBULATORY_CARE_PROVIDER_SITE_OTHER): Payer: BC Managed Care – PPO | Admitting: Family Medicine

## 2022-12-30 ENCOUNTER — Encounter: Payer: Self-pay | Admitting: Family Medicine

## 2022-12-30 VITALS — BP 160/88 | HR 63 | Ht 60.0 in | Wt 192.2 lb

## 2022-12-30 DIAGNOSIS — I10 Essential (primary) hypertension: Secondary | ICD-10-CM

## 2022-12-30 DIAGNOSIS — E039 Hypothyroidism, unspecified: Secondary | ICD-10-CM | POA: Diagnosis not present

## 2022-12-30 DIAGNOSIS — R200 Anesthesia of skin: Secondary | ICD-10-CM | POA: Insufficient documentation

## 2022-12-30 MED ORDER — VALSARTAN 160 MG PO TABS: 160.0000 mg | ORAL_TABLET | Freq: Every day | ORAL | 3 refills | Status: DC

## 2022-12-30 NOTE — Assessment & Plan Note (Signed)
Will repeat TSH levels today

## 2022-12-30 NOTE — Assessment & Plan Note (Signed)
Pt having some numbness of shin. Seen by vascular likely some venous insufficiency. Reiterated importance of wearing compression stockings and she will try this.  We will follow in 2 weeks

## 2022-12-30 NOTE — Assessment & Plan Note (Addendum)
Bp uncontrolled, will increase valsartan to 160mg  - follow up in 2 weeks for HTN follow up

## 2022-12-31 LAB — CMP14+EGFR
ALT: 14 [IU]/L (ref 0–32)
AST: 18 [IU]/L (ref 0–40)
Albumin: 4.5 g/dL (ref 3.9–4.9)
Alkaline Phosphatase: 77 [IU]/L (ref 44–121)
BUN/Creatinine Ratio: 14 (ref 12–28)
BUN: 15 mg/dL (ref 8–27)
Bilirubin Total: 0.3 mg/dL (ref 0.0–1.2)
CO2: 20 mmol/L (ref 20–29)
Calcium: 9.5 mg/dL (ref 8.7–10.3)
Chloride: 106 mmol/L (ref 96–106)
Creatinine, Ser: 1.05 mg/dL — ABNORMAL HIGH (ref 0.57–1.00)
Globulin, Total: 2.4 g/dL (ref 1.5–4.5)
Glucose: 92 mg/dL (ref 70–99)
Potassium: 4.4 mmol/L (ref 3.5–5.2)
Sodium: 140 mmol/L (ref 134–144)
Total Protein: 6.9 g/dL (ref 6.0–8.5)
eGFR: 60 mL/min/{1.73_m2} (ref >59)

## 2022-12-31 LAB — LIPID PANEL
Chol/HDL Ratio: 3.4 {ratio} (ref 0.0–4.4)
Cholesterol, Total: 203 mg/dL — ABNORMAL HIGH (ref 100–199)
HDL: 60 mg/dL (ref >39)
LDL Chol Calc (NIH): 129 mg/dL — ABNORMAL HIGH (ref 0–99)
Triglycerides: 75 mg/dL (ref 0–149)
VLDL Cholesterol Cal: 14 mg/dL (ref 5–40)

## 2022-12-31 LAB — TSH+FREE T4
Free T4: 1.37 ng/dL (ref 0.82–1.77)
TSH: 4.42 u[IU]/mL (ref 0.450–4.500)

## 2023-01-16 ENCOUNTER — Ambulatory Visit: Payer: BC Managed Care – PPO | Admitting: Family Medicine

## 2023-01-16 ENCOUNTER — Encounter: Payer: Self-pay | Admitting: Family Medicine

## 2023-01-16 VITALS — BP 143/84 | HR 64 | Ht 60.0 in | Wt 194.0 lb

## 2023-01-16 DIAGNOSIS — I10 Essential (primary) hypertension: Secondary | ICD-10-CM

## 2023-01-16 DIAGNOSIS — E039 Hypothyroidism, unspecified: Secondary | ICD-10-CM | POA: Diagnosis not present

## 2023-01-16 NOTE — Progress Notes (Signed)
     Established patient visit   Patient: Mckenzie Mckinney   DOB: June 10, 1960   62 y.o. Female  MRN: [id number] Visit Date: 01/16/2023  Today's healthcare provider: Charlton Amor, DO   Chief Complaint  Patient presents with   Hypertension    SUBJECTIVE    Chief Complaint  Patient presents with   Hypertension   HPI   Pt presents for 2 week HTN follow up. Valsartan was increased to 160mg  however patient is not taking this and is only taking 80mg . BP today is 143/85  Review of Systems  Constitutional:  Negative for activity change, fatigue and fever.  Respiratory:  Negative for cough and shortness of breath.   Cardiovascular:  Negative for chest pain.  Gastrointestinal:  Negative for abdominal pain.  Genitourinary:  Negative for difficulty urinating.       Current Meds  Medication Sig   levothyroxine (SYNTHROID) 88 MCG tablet Take 1 tablet (88 mcg total) by mouth daily.   valsartan (DIOVAN) 160 MG tablet Take 1 tablet (160 mg total) by mouth daily.    OBJECTIVE    BP (!) 143/84 (BP Location: Right Arm, Patient Position: Sitting, Cuff Size: Large)   Pulse 64   Ht 5' (1.524 m)   Wt 194 lb (88 kg)   SpO2 100%   BMI 37.89 kg/m   Physical Exam Vitals and nursing note reviewed.  Constitutional:      General: She is not in acute distress.    Appearance: Normal appearance.  HENT:     Head: Normocephalic and atraumatic.     Right Ear: External ear normal.     Left Ear: External ear normal.     Nose: Nose normal.  Eyes:     Conjunctiva/sclera: Conjunctivae normal.  Cardiovascular:     Rate and Rhythm: Normal rate and regular rhythm.  Pulmonary:     Effort: Pulmonary effort is normal.     Breath sounds: Normal breath sounds.  Neurological:     General: No focal deficit present.     Mental Status: She is alert and oriented to person, place, and time.  Psychiatric:        Mood and Affect: Mood normal.        Behavior: Behavior normal.        Thought Content:  Thought content normal.        Judgment: Judgment normal.        ASSESSMENT & PLAN    Problem List Items Addressed This Visit       Cardiovascular and Mediastinum   Essential hypertension    Bp uncontrolled however patient is not taking 160mg  and is only taking 80mg  will have her do two week nurse visit for HTN check        Endocrine   Hypothyroidism - Primary    Will check tsh in 3 months      Relevant Orders   TSH     Return in about 2 weeks (around 01/30/2023) for nurse visit.      No orders of the defined types were placed in this encounter.   Orders Placed This Encounter  Procedures   TSH     Charlton Amor, DO  [facility] Primary Care & Sports Medicine at [facility] [phone] (phone) [phone] (fax)  [facility]

## 2023-01-16 NOTE — Assessment & Plan Note (Signed)
Will check tsh in 3 months

## 2023-01-16 NOTE — Assessment & Plan Note (Signed)
Bp uncontrolled however patient is not taking 160mg  and is only taking 80mg  will have her do two week nurse visit for HTN check

## 2023-01-30 ENCOUNTER — Ambulatory Visit (INDEPENDENT_AMBULATORY_CARE_PROVIDER_SITE_OTHER): Payer: BC Managed Care – PPO

## 2023-01-30 VITALS — BP 154/83 | HR 70

## 2023-01-30 DIAGNOSIS — I10 Essential (primary) hypertension: Secondary | ICD-10-CM | POA: Diagnosis not present

## 2023-01-30 MED ORDER — HYDROCHLOROTHIAZIDE 12.5 MG PO TABS
12.5000 mg | ORAL_TABLET | Freq: Every day | ORAL | 3 refills | Status: DC
Start: 2023-01-30 — End: 2023-02-14

## 2023-01-30 NOTE — Progress Notes (Signed)
   Established Patient Office Visit  Subjective   Patient ID: Mckenzie Mckinney, female    DOB: 31-Aug-1960  Age: 62 y.o. MRN: [id number]  Chief Complaint  Patient presents with   Hypertension    HPI  Mckenzie Mckinney is here for blood pressure check. Denies chest pain, shortness of breath or dizziness.   ROS    Objective:     BP (!) 158/83   Pulse 70   SpO2 100%    Physical Exam   No results found for any visits on 01/30/23.    The 10-year ASCVD risk score (Arnett DK, et al., 2019) is: 12.4%    Assessment & Plan:  HTN - First and second check of blood pressure was elevated.   Problem List Items Addressed This Visit       Unprioritized   Essential hypertension - Primary    Return in about 2 weeks (around 02/13/2023) for nurse visit blood pressure check. Earna Coder, Janalyn Harder, CMA

## 2023-01-30 NOTE — Addendum Note (Signed)
Addended by: Charlton Amor on: 01/30/2023 04:38 PM   Modules accepted: Orders

## 2023-02-01 ENCOUNTER — Other Ambulatory Visit: Payer: Self-pay

## 2023-02-01 MED ORDER — LEVOTHYROXINE SODIUM 88 MCG PO TABS: 88.0000 ug | ORAL_TABLET | Freq: Every day | ORAL | 0 refills | Status: DC

## 2023-02-14 ENCOUNTER — Ambulatory Visit: Payer: BC Managed Care – PPO | Admitting: Family Medicine

## 2023-02-14 ENCOUNTER — Encounter: Payer: Self-pay | Admitting: Family Medicine

## 2023-02-14 DIAGNOSIS — I10 Essential (primary) hypertension: Secondary | ICD-10-CM | POA: Diagnosis not present

## 2023-02-14 MED ORDER — VALSARTAN 160 MG PO TABS: 160.0000 mg | ORAL_TABLET | Freq: Every day | ORAL | 2 refills | Status: DC

## 2023-02-14 MED ORDER — HYDROCHLOROTHIAZIDE 12.5 MG PO TABS: 12.5000 mg | ORAL_TABLET | Freq: Every day | ORAL | 2 refills | Status: DC

## 2023-02-14 NOTE — Progress Notes (Signed)
Established patient visit   Patient: Mckenzie Mckinney   DOB: 09-09-1960   62 y.o. Female  MRN: [id number] Visit Date: 02/14/2023  Today's healthcare provider: Charlton Amor, DO   Chief Complaint  Patient presents with   Medical Management of Chronic Issues    HTN    SUBJECTIVE    Chief Complaint  Patient presents with   Medical Management of Chronic Issues    HTN   HPI HPI     Medical Management of Chronic Issues    Additional comments: HTN      Last edited by Roselyn Reef, CMA on 02/14/2023  9:34 AM.      Pt presents for HTN follow up. BP elevated at last visit  - hydrochlorothiazide 12.5mg   - valsartan 160mg    Review of Systems  Constitutional:  Negative for activity change, fatigue and fever.  Respiratory:  Negative for cough and shortness of breath.   Cardiovascular:  Negative for chest pain.  Gastrointestinal:  Negative for abdominal pain.  Genitourinary:  Negative for difficulty urinating.       Current Meds  Medication Sig   levothyroxine (SYNTHROID) 88 MCG tablet Take 1 tablet (88 mcg total) by mouth daily.   [DISCONTINUED] hydrochlorothiazide (HYDRODIURIL) 12.5 MG tablet Take 1 tablet (12.5 mg total) by mouth daily.   [DISCONTINUED] valsartan (DIOVAN) 160 MG tablet Take 1 tablet (160 mg total) by mouth daily.    OBJECTIVE    BP 126/70 (BP Location: Left Arm, Patient Position: Sitting, Cuff Size: Large)   Pulse 63   Ht 5' (1.524 m)   Wt 190 lb 8 oz (86.4 kg)   SpO2 100%   BMI 37.20 kg/m   Physical Exam Vitals and nursing note reviewed.  Constitutional:      General: She is not in acute distress.    Appearance: Normal appearance.  HENT:     Head: Normocephalic and atraumatic.     Right Ear: External ear normal.     Left Ear: External ear normal.     Nose: Nose normal.  Eyes:     Conjunctiva/sclera: Conjunctivae normal.  Cardiovascular:     Rate and Rhythm: Normal rate and regular rhythm.  Pulmonary:     Effort: Pulmonary  effort is normal.     Breath sounds: Normal breath sounds.  Neurological:     General: No focal deficit present.     Mental Status: She is alert and oriented to person, place, and time.  Psychiatric:        Mood and Affect: Mood normal.        Behavior: Behavior normal.        Thought Content: Thought content normal.        Judgment: Judgment normal.        ASSESSMENT & PLAN    Problem List Items Addressed This Visit       Cardiovascular and Mediastinum   Essential hypertension    BP well controlled today, continue hydrochlorothiazide and valsartan 160mg  - will get blood work at next visit to eval kidney function      Relevant Medications   valsartan (DIOVAN) 160 MG tablet   hydrochlorothiazide (HYDRODIURIL) 12.5 MG tablet    Return for pt has follow up scheduled in two months that we will keep.      Meds ordered this encounter  Medications   valsartan (DIOVAN) 160 MG tablet    Sig: Take 1 tablet (160 mg total) by mouth daily.  Dispense:  90 tablet    Refill:  2   hydrochlorothiazide (HYDRODIURIL) 12.5 MG tablet    Sig: Take 1 tablet (12.5 mg total) by mouth daily.    Dispense:  90 tablet    Refill:  2    No orders of the defined types were placed in this encounter.    Charlton Amor, DO  [facility] Primary Care & Sports Medicine at [facility] [phone] (phone) [phone] (fax)  [facility]

## 2023-02-14 NOTE — Addendum Note (Signed)
Addended by: Charlton Amor on: 02/14/2023 09:47 AM   Modules accepted: Level of Service

## 2023-02-14 NOTE — Assessment & Plan Note (Addendum)
BP well controlled today, continue hydrochlorothiazide and valsartan 160mg  - will get blood work at next visit to eval kidney function

## 2023-04-19 ENCOUNTER — Other Ambulatory Visit: Payer: Self-pay

## 2023-04-19 DIAGNOSIS — I10 Essential (primary) hypertension: Secondary | ICD-10-CM

## 2023-04-20 ENCOUNTER — Encounter: Payer: Self-pay | Admitting: Family Medicine

## 2023-04-20 ENCOUNTER — Ambulatory Visit: Payer: Managed Care, Other (non HMO) | Admitting: Family Medicine

## 2023-04-20 VITALS — BP 112/75 | HR 67 | Ht 60.0 in | Wt 190.2 lb

## 2023-04-20 DIAGNOSIS — E039 Hypothyroidism, unspecified: Secondary | ICD-10-CM

## 2023-04-20 DIAGNOSIS — I10 Essential (primary) hypertension: Secondary | ICD-10-CM | POA: Diagnosis not present

## 2023-04-20 MED ORDER — VALSARTAN 160 MG PO TABS: 160.0000 mg | ORAL_TABLET | Freq: Every day | ORAL | 2 refills | Status: DC

## 2023-04-20 MED ORDER — HYDROCHLOROTHIAZIDE 12.5 MG PO TABS: 12.5000 mg | ORAL_TABLET | Freq: Every day | ORAL | 2 refills | Status: DC

## 2023-04-20 MED ORDER — VALSARTAN 160 MG PO TABS
160.0000 mg | ORAL_TABLET | Freq: Every day | ORAL | 2 refills | Status: DC
Start: 2023-04-20 — End: 2023-09-01

## 2023-04-20 MED ORDER — LEVOTHYROXINE SODIUM 88 MCG PO TABS: 88.0000 ug | ORAL_TABLET | Freq: Every day | ORAL | 0 refills | Status: DC

## 2023-04-20 MED ORDER — HYDROCHLOROTHIAZIDE 12.5 MG PO TABS
12.5000 mg | ORAL_TABLET | Freq: Every day | ORAL | 2 refills | Status: DC
Start: 2023-04-20 — End: 2023-12-19

## 2023-04-20 NOTE — Assessment & Plan Note (Signed)
 Doing well will refill synthroid  [facility]

## 2023-04-20 NOTE — Assessment & Plan Note (Signed)
 Bp well controlled will follow up in 3 months and get bmp

## 2023-04-20 NOTE — Progress Notes (Signed)
 Established patient visit   Patient: Mckenzie Mckinney   DOB: January 06, 1961   63 y.o. Female  MRN: [id number] Visit Date: 04/20/2023  Today's healthcare provider: Bernice GORMAN Juneau, DO   Chief Complaint  Patient presents with   Medical Management of Chronic Issues    HTN    SUBJECTIVE    Chief Complaint  Patient presents with   Medical Management of Chronic Issues    HTN   HPI HPI     Medical Management of Chronic Issues    Additional comments: HTN      Last edited by Duwaine Riggs, CMA on 04/20/2023  3:29 PM.       Pt presents for follow up on HTN. Currently on hydrochlorothiazide  12.5mg  and valsartan  160mg  daily. BP well controlled today.  Needs [facility]ill [facility]roid  [facility]. Doing well with no symptom issues.   Review of Systems  Constitutional:  Negative for activity change, fatigue and fever.  Respiratory:  Negative for cough and shortness of breath.   Cardiovascular:  Negative for chest pain.  Gastrointestinal:  Negative for abdominal pain.  Genitourinary:  Negative for difficulty urinating.       Current Meds  Medication Sig   [DISCONTINUED] hydrochlorothiazide  (HYDRODIURIL ) 12.5 MG tablet Take 1 tablet (12.5 mg total) by mouth daily.   [DISCONTINUED] levothyroxine  (SYNTHROID ) 88 MCG tablet Take 1 tablet (88 mcg total) by mouth daily.   [DISCONTINUED] valsartan  (DIOVAN ) 160 MG tablet Take 1 tablet (160 mg total) by mouth daily.    OBJECTIVE    BP 112/75 (BP Location: Left Arm, Patient Position: Sitting, Cuff Size: Large)   Pulse 67   Ht 5' (1.524 m)   Wt 190 lb 4 oz (86.3 kg)   SpO2 100%   BMI 37.16 kg/m   Physical Exam Vitals and nursing note reviewed.  Constitutional:      General: She is not in acute distress.    Appearance: Normal appearance.  HENT:     Head: Normocephalic and atraumatic.     Right Ear: External ear normal.     Left Ear: External ear normal.     Nose: Nose normal.  Eyes:     Conjunctiva/sclera: Conjunctivae  normal.  Cardiovascular:     Rate and Rhythm: Normal rate and regular rhythm.  Pulmonary:     Effort: Pulmonary effort is normal.     Breath sounds: Normal breath sounds.  Neurological:     General: No focal deficit present.     Mental Status: She is alert and oriented to person, place, and time.  Psychiatric:        Mood and Affect: Mood normal.        Behavior: Behavior normal.        Thought Content: Thought content normal.        Judgment: Judgment normal.        ASSESSMENT & PLAN    Problem List Items Addressed This Visit       Cardiovascular and Mediastinum   Essential hypertension   Bp well controlled will follow up in 3 months and get bmp       Relevant Medications   hydrochlorothiazide  (HYDRODIURIL ) 12.5 MG tablet   valsartan  (DIOVAN ) 160 MG tablet     Endocrine   Hypothyroidism - Primary   Doing well will [facility]ill [facility]        Relevant Medications   levothyroxine  (SYNTHROID ) 88 MCG tablet    Return in about 4  months (around 08/09/2023).      Meds ordered this encounter  Medications   DISCONTD: levothyroxine  (SYNTHROID ) 88 MCG tablet    Sig: Take 1 tablet (88 mcg total) by mouth daily.    Dispense:  90 tablet    Refill:  0   DISCONTD: hydrochlorothiazide  (HYDRODIURIL ) 12.5 MG tablet    Sig: Take 1 tablet (12.5 mg total) by mouth daily.    Dispense:  90 tablet    Refill:  2   DISCONTD: valsartan  (DIOVAN ) 160 MG tablet    Sig: Take 1 tablet (160 mg total) by mouth daily.    Dispense:  90 tablet    Refill:  2   hydrochlorothiazide  (HYDRODIURIL ) 12.5 MG tablet    Sig: Take 1 tablet (12.5 mg total) by mouth daily.    Dispense:  90 tablet    Refill:  2   levothyroxine  (SYNTHROID ) 88 MCG tablet    Sig: Take 1 tablet (88 mcg total) by mouth daily.    Dispense:  90 tablet    Refill:  0   valsartan  (DIOVAN ) 160 MG tablet    Sig: Take 1 tablet (160 mg total) by mouth daily.    Dispense:  90 tablet    Refill:  2    No orders of  the defined types were placed in this encounter.    Bernice GORMAN Juneau, DO  [facility] Primary Care & Sports Medicine at [facility] [phone] (phone) [phone] (fax)  [facility]

## 2023-05-10 ENCOUNTER — Other Ambulatory Visit: Payer: Self-pay | Admitting: Family Medicine

## 2023-06-12 ENCOUNTER — Encounter: Payer: Self-pay | Admitting: Physician Assistant

## 2023-06-12 ENCOUNTER — Ambulatory Visit: Admitting: Physician Assistant

## 2023-06-12 ENCOUNTER — Ambulatory Visit: Payer: Self-pay | Admitting: *Deleted

## 2023-06-12 VITALS — BP 136/80 | HR 80 | Temp 98.7°F | Ht 60.0 in | Wt 190.0 lb

## 2023-06-12 DIAGNOSIS — J101 Influenza due to other identified influenza virus with other respiratory manifestations: Secondary | ICD-10-CM | POA: Diagnosis not present

## 2023-06-12 DIAGNOSIS — R6889 Other general symptoms and signs: Secondary | ICD-10-CM | POA: Diagnosis not present

## 2023-06-12 LAB — POCT INFLUENZA A/B
Influenza A, POC: NEGATIVE
Influenza B, POC: POSITIVE — AB

## 2023-06-12 LAB — POC COVID19 BINAXNOW: SARS Coronavirus 2 Ag: NEGATIVE

## 2023-06-12 LAB — POCT RAPID STREP A (OFFICE): Rapid Strep A Screen: NEGATIVE

## 2023-06-12 MED ORDER — FLUTICASONE PROPIONATE 50 MCG/ACT NA SUSP: 2.0000 | Freq: Every day | NASAL | 0 refills | Status: DC

## 2023-06-12 MED ORDER — PROMETHAZINE-DM 6.25-15 MG/5ML PO SYRP: 5.0000 mL | ORAL_SOLUTION | Freq: Four times a day (QID) | ORAL | 0 refills | Status: DC | PRN

## 2023-06-12 NOTE — Telephone Encounter (Signed)
  Chief Complaint: continued productive cough Symptoms: cough thick yellow mucus . Cough not getter better Frequency: 2 weeks  Pertinent Negatives: Patient denies chest pain no difficulty breathing no fever Disposition: [] ED /[] Urgent Care (no appt availability in office) / [x] Appointment(In office/virtual)/ []  [facility]/ [] Home Care/ [] Refused Recommended Disposition /[] [facility]/ []  Follow-up with PCP Additional Notes:   Requested appt. Scheduled appt with other provider  for today . PCP out on leave.      Copied from CRM [id number]. Topic: Clinical - Red Word Triage >> Jun 12, 2023 10:32 AM Philippa Chester F wrote: Red Word that prompted transfer to Nurse Triage:   Increased Cough;    Called in with increased congestion for two weeks and over the last week has developed a cough; Patient stated during the first week there was thick yello muscous but now nothing is coming up but the mucous can still be felt in nose and throat  Contact Information:   Nolita Kutter  [facility]: [phone] Reason for Disposition  [1] Continuous (nonstop) coughing interferes with work or school AND [2] no improvement using cough treatment per Care Advice  Answer Assessment - Initial Assessment Questions 1. ONSET: "When did the cough begin?"      2 weeks ago  2. SEVERITY: "How bad is the cough today?"      Not getting better 3. SPUTUM: "Describe the color of your sputum" (none, dry cough; clear, white, yellow, green)     Thick yellow mucus 4. HEMOPTYSIS: "Are you coughing up any blood?" If so ask: "How much?" (flecks, streaks, tablespoons, etc.)     na 5. DIFFICULTY BREATHING: "Are you having difficulty breathing?" If Yes, ask: "How bad is it?" (e.g., mild, moderate, severe)    - MILD: No SOB at rest, mild SOB with walking, speaks normally in sentences, can lie down, no retractions, pulse < 100.    - MODERATE: SOB at rest, SOB with minimal exertion and prefers to sit, cannot lie down  flat, speaks in phrases, mild retractions, audible wheezing, pulse 100-120.    - SEVERE: Very SOB at rest, speaks in single words, struggling to breathe, sitting hunched forward, retractions, pulse > 120      Denies 6. FEVER: "Do you have a fever?" If Yes, ask: "What is your temperature, how was it measured, and when did it start?"     no 7. CARDIAC HISTORY: "Do you have any history of heart disease?" (e.g., heart attack, congestive heart failure)      na 8. LUNG HISTORY: "Do you have any history of lung disease?"  (e.g., pulmonary embolus, asthma, emphysema)     na 9. PE RISK FACTORS: "Do you have a history of blood clots?" (or: recent major surgery, recent prolonged travel, bedridden)     na 10. OTHER SYMPTOMS: "Do you have any other symptoms?" (e.g., runny nose, wheezing, chest pain)       Cough thick yellow mucus 11. PREGNANCY: "Is there any chance you are pregnant?" "When was your last menstrual period?"       na 12. TRAVEL: "Have you traveled out of the country in the last month?" (e.g., travel history, exposures)       na  Protocols used: Cough - Acute Productive-A-AH

## 2023-06-12 NOTE — Patient Instructions (Addendum)
 Flonase start with twice a day for 3 days then once a day. Use nettie pot before.   Cough syrup as needed  Start claritin D for next 1-2 weeks daily  Influenza, Adult Influenza is also called the flu. It's an infection that affects your respiratory tract. This includes your nose, throat, windpipe, and lungs. The flu is contagious. This means it spreads easily from person to person. It causes symptoms that are like a cold. It can also cause a high fever and body aches. What are the causes? The flu is caused by the influenza virus. You can get it by: Breathing in droplets that are in the air after an infected person coughs or sneezes. Touching something that has the virus on it and then touching your mouth, nose, or eyes. What increases the risk? You may be more likely to get the flu if: You don't wash your hands often. You're near a lot of people during cold and flu season. You touch your mouth, eyes, or nose without washing your hands first. You don't get a flu shot each year. You may also be more at risk for the flu and serious problems, such as a lung infection called pneumonia, if: You're older than 65. You're pregnant. Your immune system is weak. Your immune system is your body's defense system. You have a long-term, or chronic, condition, such as: Heart, kidney, or lung disease. Diabetes. A liver disorder. Asthma. You're very overweight. You have anemia. This is when you don't have enough red blood cells in your body. What are the signs or symptoms? Flu symptoms often start all of a sudden. They may last 4-14 days and include: Fever and chills. Headaches, body aches, or muscle aches. Sore throat. Cough. Runny or stuffy nose. Discomfort in your chest. Not wanting to eat as much as normal. Feeling weak or tired. Feeling dizzy. Nausea or vomiting. How is this diagnosed? The flu may be diagnosed based on your symptoms and medical history. You may also have a physical exam.  A swab may be taken from your nose or throat and tested for the virus. How is this treated? If the flu is found early, you can be treated with antiviral medicine. This may be given to you by mouth or through an IV. It can help you feel less sick and get better faster. Taking care of yourself at home can also help your symptoms get better. Your health care provider may tell you to: Take over-the-counter medicines. Drink lots of fluids. The flu often goes away on its own. If you have very bad symptoms or problems caused by the flu, you may need to be treated in a hospital. Follow these instructions at home: Activity Rest as needed. Get lots of sleep. Stay home from work or school as told by your provider. Leave home only to go see your provider. Do not leave home for other reasons until you don't have a fever for 24 hours without taking medicine. Eating and drinking Take an oral rehydration solution (ORS). This is a drink that is sold at pharmacies and stores. Drink enough fluid to keep your pee pale yellow. Try to drink small amounts of clear fluids. These include water, ice chips, fruit juice mixed with water, and low-calorie sports drinks. Try to eat bland foods that are easy to digest. These include bananas, applesauce, rice, lean meats, toast, and crackers. Avoid drinks that have a lot of sugar or caffeine in them. These include energy drinks, regular sports drinks,  and soda. Do not drink alcohol. Do not eat spicy or fatty foods. General instructions     Take your medicines only as told by your provider. Use a cool mist humidifier to add moisture to the air in your home. This can make it easier for you to breathe. You should also clean the humidifier every day. To do so: Empty the water. Pour clean water in. Cover your mouth and nose when you cough or sneeze. Wash your hands with soap and water often and for at least 20 seconds. It's extra important to do so after you cough or  sneeze. If you can't use soap and water, use hand sanitizer. How is this prevented?  Get a flu shot every year. Ask your provider when you should get your flu shot. Stay away from people who are sick during fall and winter. Fall and winter are cold and flu season. Contact a health care provider if: You get new symptoms. You have chest pain. You have watery poop, also called diarrhea. You have a fever. Your cough gets worse. You start to have more mucus. You feel like you may vomit, or you vomit. Get help right away if: You become short of breath or have trouble breathing. Your skin or nails turn blue. You have very bad pain or stiffness in your neck. You get a sudden headache or pain in your face or ear. You vomit each time you eat or drink. These symptoms may be an emergency. Call 911 right away. Do not wait to see if the symptoms will go away. Do not drive yourself to the hospital. This information is not intended to replace advice given to you by your health care provider. Make sure you discuss any questions you have with your health care provider. Document Revised: 12/01/2022 Document Reviewed: 04/07/2022 Elsevier Patient Education  2024 [employer].

## 2023-06-12 NOTE — Progress Notes (Unsigned)
   Acute Office Visit  Subjective:     Patient ID: Mckenzie Mckinney, female    DOB: Feb 25, 1961, 63 y.o.   MRN: [id number]  No chief complaint on file.   HPI Patient is in today for ***  ROS      Objective:    There were no vitals taken for this visit. {Vitals History (Optional):23777}  Physical Exam  No results found for any visits on 06/12/23.      Assessment & Plan:    Tandy Gaw, PA-C

## 2023-06-13 ENCOUNTER — Encounter: Payer: Self-pay | Admitting: Physician Assistant

## 2023-07-07 IMAGING — MG MM DIGITAL SCREENING BILAT W/ TOMO AND CAD
8 series · 8 of 24 positions shown · non-contrast
Comparison: Previous exam(s).

CLINICAL DATA: Screening.

EXAM:
DIGITAL SCREENING BILATERAL MAMMOGRAM WITH TOMOSYNTHESIS AND CAD
TECHNIQUE: Bilateral screening digital craniocaudal and mediolateral oblique
mammograms were obtained. Bilateral screening digital breast
tomosynthesis was performed. The images were evaluated with
computer-aided detection.

[R CC synth-2D]
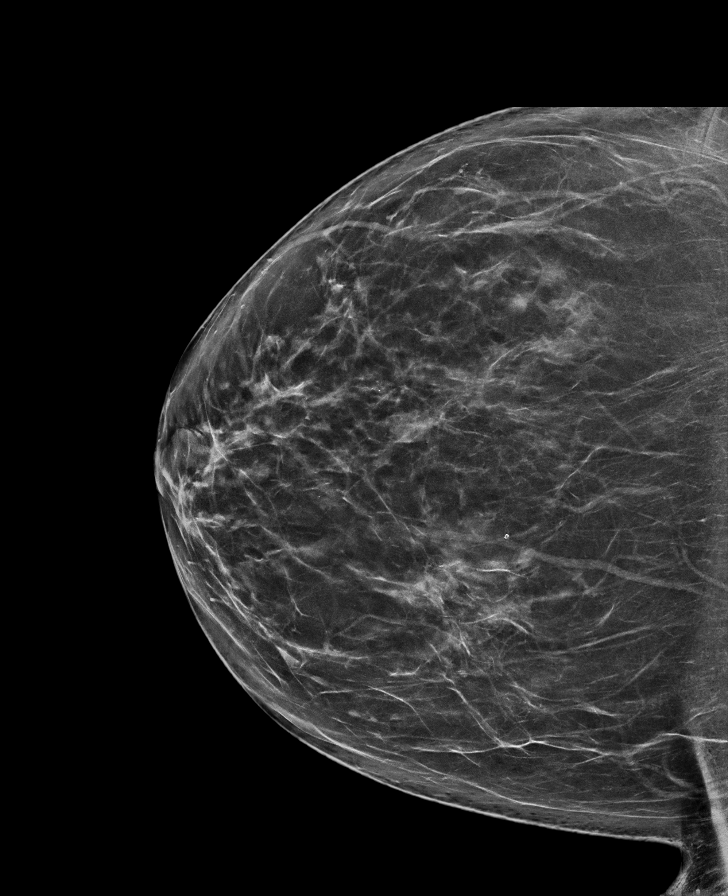

[L CC synth-2D]
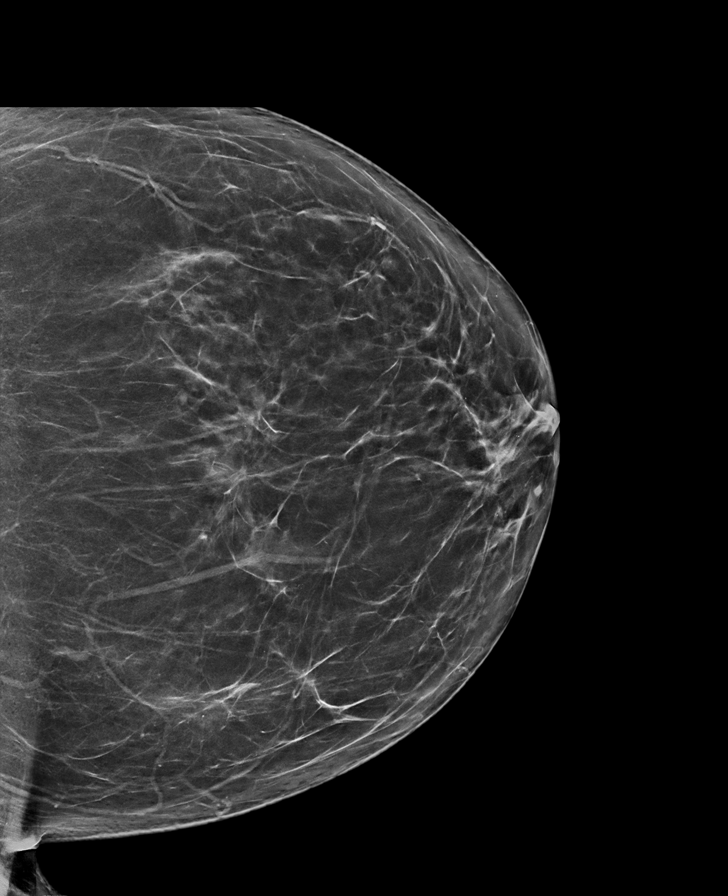

[L MLO synth-2D]
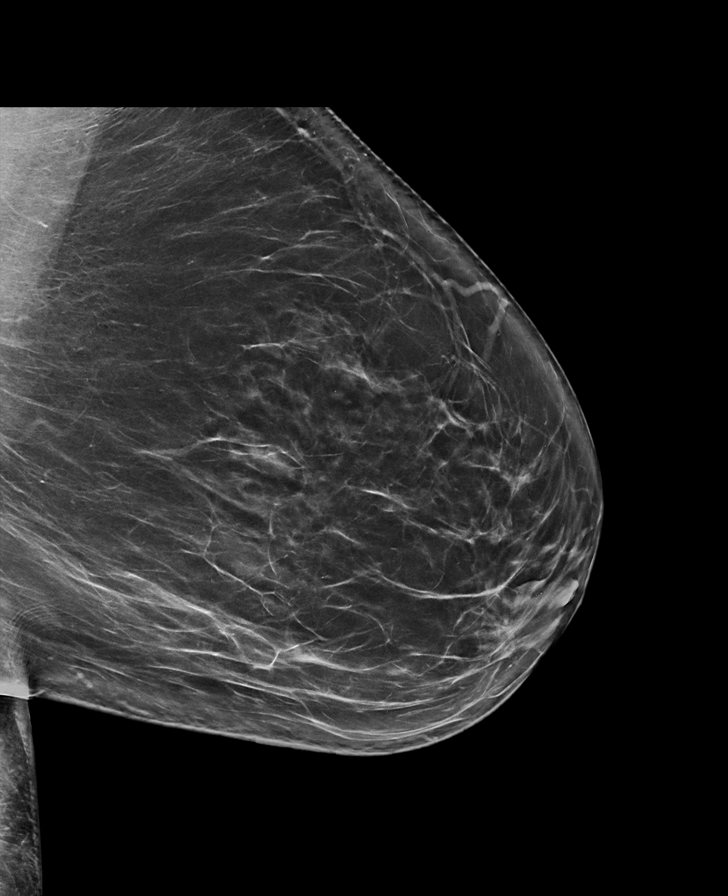

[R MLO synth-2D]
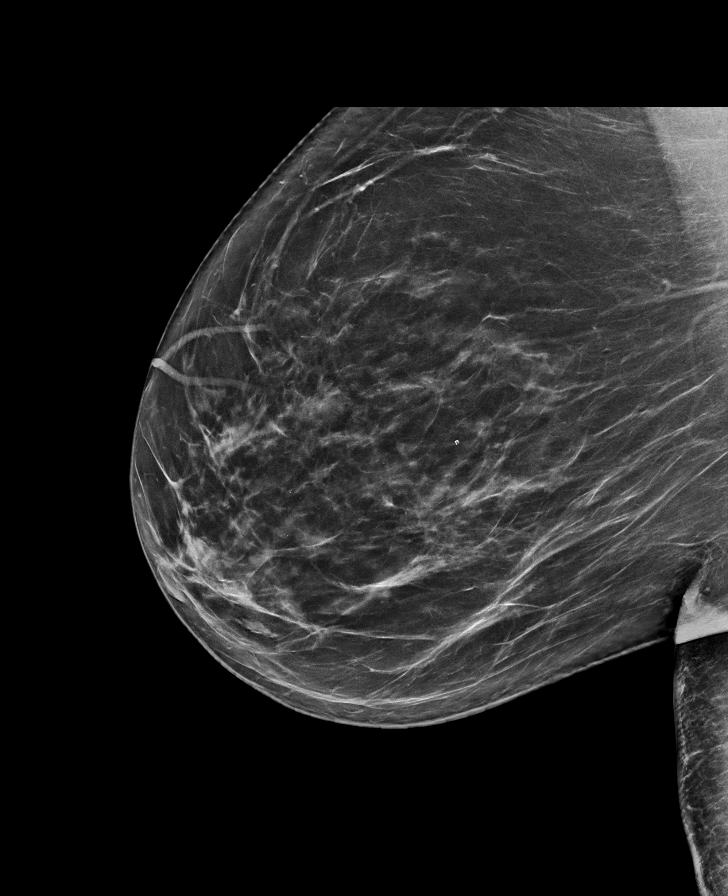

[R CC tomo · tomo slice 41/81.0]
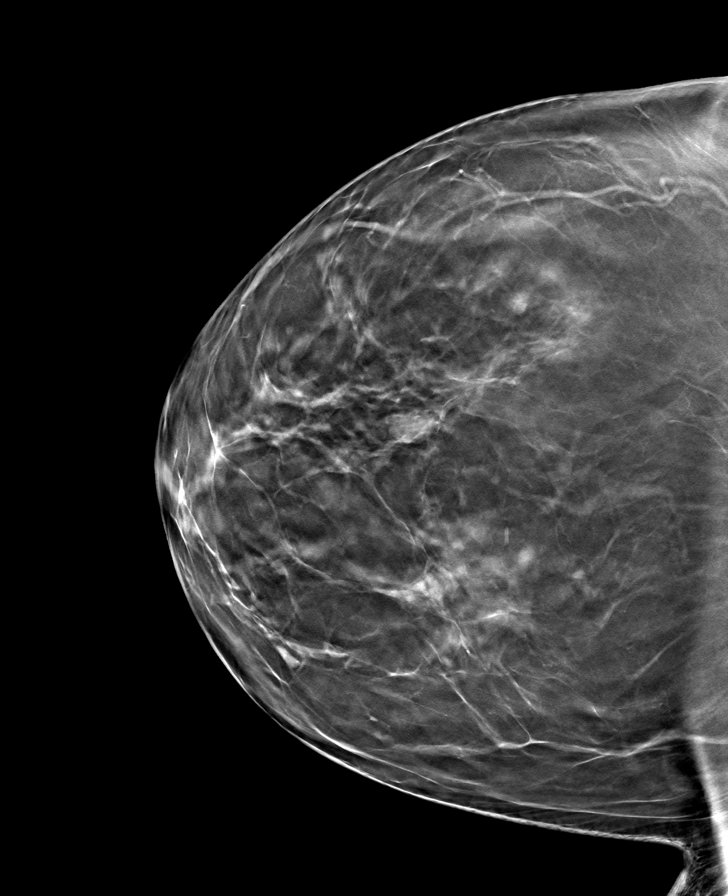

[L MLO tomo · tomo slice 45/90.0]
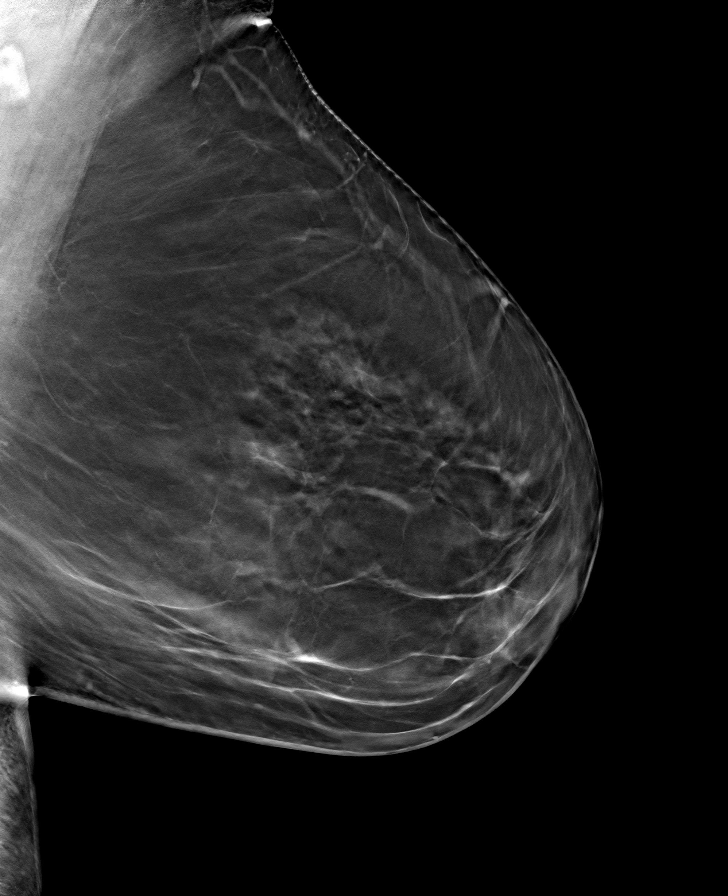

[R MLO tomo · tomo slice 45/90.0]
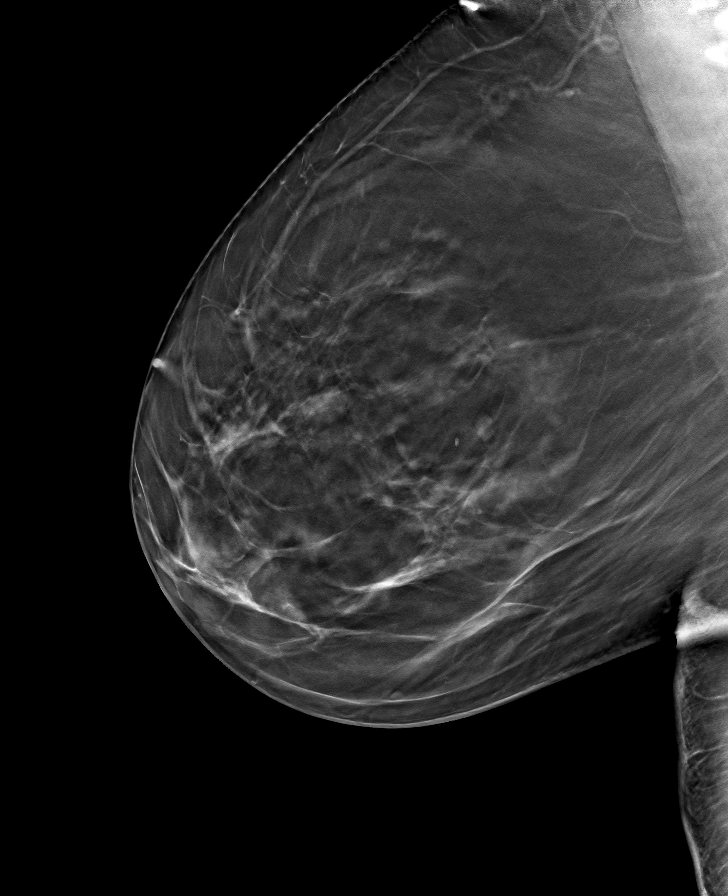

[L CC tomo · tomo slice 39/76.0]
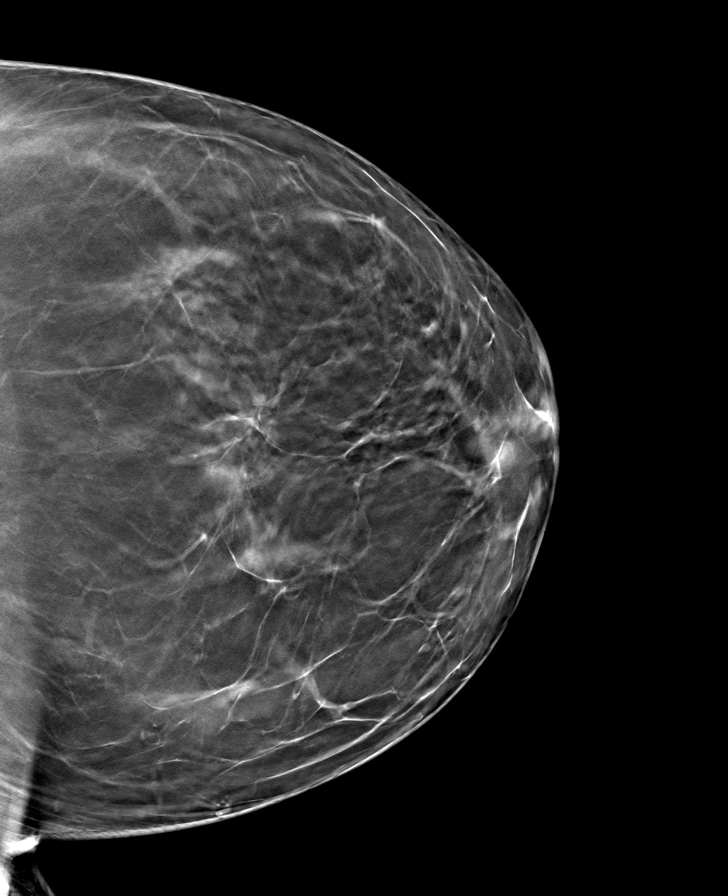

[8 of 24 positions shown; findings below may reference images not displayed]

ACR Breast Density Category b: There are scattered areas of
fibroglandular density.
FINDINGS: There are no findings suspicious for malignancy.
IMPRESSION: No mammographic evidence of malignancy. A result letter of this
screening mammogram will be mailed directly to the patient.

RECOMMENDATION:
Screening mammogram in one year. (Code:51-O-LD2)

BI-RADS CATEGORY  1: Negative.

## 2023-07-20 ENCOUNTER — Encounter: Payer: Self-pay | Admitting: Family Medicine

## 2023-08-17 ENCOUNTER — Ambulatory Visit: Payer: Managed Care, Other (non HMO) | Admitting: Family Medicine

## 2023-08-18 ENCOUNTER — Ambulatory Visit: Admitting: Physician Assistant

## 2023-08-18 ENCOUNTER — Encounter: Payer: Self-pay | Admitting: Physician Assistant

## 2023-08-18 VITALS — BP 122/61 | HR 61 | Ht 60.0 in | Wt 192.0 lb

## 2023-08-18 DIAGNOSIS — E7800 Pure hypercholesterolemia, unspecified: Secondary | ICD-10-CM

## 2023-08-18 DIAGNOSIS — I10 Essential (primary) hypertension: Secondary | ICD-10-CM

## 2023-08-18 DIAGNOSIS — Z1159 Encounter for screening for other viral diseases: Secondary | ICD-10-CM

## 2023-08-18 DIAGNOSIS — E039 Hypothyroidism, unspecified: Secondary | ICD-10-CM

## 2023-08-18 DIAGNOSIS — Z1231 Encounter for screening mammogram for malignant neoplasm of breast: Secondary | ICD-10-CM

## 2023-08-18 DIAGNOSIS — Z1211 Encounter for screening for malignant neoplasm of colon: Secondary | ICD-10-CM

## 2023-08-18 MED ORDER — LEVOTHYROXINE SODIUM 88 MCG PO TABS: 88.0000 ug | ORAL_TABLET | Freq: Every day | ORAL | 3 refills | Status: DC

## 2023-08-18 MED ORDER — LEVOTHYROXINE SODIUM 88 MCG PO TABS: 88.0000 ug | ORAL_TABLET | Freq: Every day | ORAL | 0 refills | Status: DC

## 2023-08-18 NOTE — Progress Notes (Signed)
   Established Patient Office Visit  Subjective   Patient ID: Mckenzie Mckinney, female    DOB: 20-Apr-1960  Age: 63 y.o. MRN: [id number]  Chief Complaint  Patient presents with   Medical Management of Chronic Issues    HPI Pt is a 63 yo female with HTN, HLD and hypothyroidism. She needs medication refills.   She is doing well no concerns. She denies any CP, palpitations, headaches or vision changes. She is compliant with medications.     Review of Systems  All other systems reviewed and are negative.     Objective:     BP 122/61   Pulse 61   Ht 5' (1.524 m)   Wt 192 lb (87.1 kg)   SpO2 99%   BMI 37.50 kg/m    Physical Exam Constitutional:      Appearance: Normal appearance. She is obese.  Cardiovascular:     Rate and Rhythm: Normal rate and regular rhythm.  Pulmonary:     Effort: Pulmonary effort is normal.     Breath sounds: Normal breath sounds.  Musculoskeletal:     Right lower leg: No edema.     Left lower leg: No edema.  Neurological:     General: No focal deficit present.     Mental Status: She is alert and oriented to person, place, and time.  Psychiatric:        Mood and Affect: Mood normal.       The 10-year ASCVD risk score (Arnett DK, et al., 2019) is: 6.2%    Assessment & Plan:  Aaron AasAaron AasDorine was seen today for medical management of chronic issues.  Diagnoses and all orders for this visit:  Hypercholesteremia -     Lipid panel  Hypothyroidism, unspecified type -     TSH + free T4  Essential hypertension -     CMP14+EGFR  Visit for screening mammogram -     MM 3D SCREENING MAMMOGRAM BILATERAL BREAST; Future  Colon cancer screening -     Ambulatory referral to Gastroenterology  Encounter for hepatitis C screening test for low risk patient -     Hepatitis C Antibody  Other orders -     Discontinue: levothyroxine  (SYNTHROID ) 88 MCG tablet; Take 1 tablet (88 mcg total) by mouth daily before breakfast. TAKE 1 TABLET(88 MCG) BY MOUTH  DAILY -     Discontinue: levothyroxine  (SYNTHROID ) 88 MCG tablet; Take 1 tablet (88 mcg total) by mouth daily before breakfast. TAKE 1 TABLET(88 MCG) BY MOUTH DAILY -     levothyroxine  (SYNTHROID ) 88 MCG tablet; Take 1 tablet (88 mcg total) by mouth daily before breakfast. TAKE 1 TABLET(88 MCG) BY MOUTH DAILY   Vitals look great Need labs BP medications do not need refilled Sent synthroid   Will adjust medications accordingly Colonoscopy and mammogram ordered and strongly encouraged to schedule Follow up in 6 months   Return in about 6 months (around 02/17/2024).    Kataryna Mcquilkin, PA-C

## 2023-08-21 ENCOUNTER — Encounter: Payer: Self-pay | Admitting: Physician Assistant

## 2023-08-21 MED ORDER — LEVOTHYROXINE SODIUM 88 MCG PO TABS: 88.0000 ug | ORAL_TABLET | Freq: Every day | ORAL | Status: DC

## 2023-08-22 LAB — CMP14+EGFR
ALT: 12 IU/L (ref 0–32)
AST: 17 IU/L (ref 0–40)
Albumin: 4.4 g/dL (ref 3.9–4.9)
Alkaline Phosphatase: 90 IU/L (ref 44–121)
BUN/Creatinine Ratio: 14 (ref 12–28)
BUN: 14 mg/dL (ref 8–27)
Bilirubin Total: 0.2 mg/dL (ref 0.0–1.2)
CO2: 19 mmol/L — ABNORMAL LOW (ref 20–29)
Calcium: 9.6 mg/dL (ref 8.7–10.3)
Chloride: 108 mmol/L — ABNORMAL HIGH (ref 96–106)
Creatinine, Ser: 0.99 mg/dL (ref 0.57–1.00)
Globulin, Total: 2.3 g/dL (ref 1.5–4.5)
Glucose: 94 mg/dL (ref 70–99)
Potassium: 4.3 mmol/L (ref 3.5–5.2)
Sodium: 142 mmol/L (ref 134–144)
Total Protein: 6.7 g/dL (ref 6.0–8.5)
eGFR: 65 mL/min/{1.73_m2} (ref >59)

## 2023-08-22 LAB — LIPID PANEL
Chol/HDL Ratio: 3.3 ratio (ref 0.0–4.4)
Cholesterol, Total: 180 mg/dL (ref 100–199)
HDL: 54 mg/dL (ref >39)
LDL Chol Calc (NIH): 112 mg/dL — ABNORMAL HIGH (ref 0–99)
Triglycerides: 77 mg/dL (ref 0–149)
VLDL Cholesterol Cal: 14 mg/dL (ref 5–40)

## 2023-08-22 LAB — TSH+FREE T4
Free T4: 1.2 ng/dL (ref 0.82–1.77)
TSH: 7.07 u[IU]/mL — ABNORMAL HIGH (ref 0.450–4.500)

## 2023-08-22 LAB — HEPATITIS C ANTIBODY: Hep C Virus Ab: NONREACTIVE

## 2023-08-23 ENCOUNTER — Ambulatory Visit: Payer: Self-pay | Admitting: Physician Assistant

## 2023-08-23 DIAGNOSIS — E039 Hypothyroidism, unspecified: Secondary | ICD-10-CM

## 2023-08-23 MED ORDER — LEVOTHYROXINE SODIUM 100 MCG PO TABS
100.0000 ug | ORAL_TABLET | Freq: Every day | ORAL | 0 refills | Status: DC
Start: 2023-08-23 — End: 2023-10-06

## 2023-08-23 NOTE — Addendum Note (Signed)
 Addended by: Araceli Knight on: 08/23/2023 04:25 PM   Modules accepted: Orders

## 2023-08-23 NOTE — Progress Notes (Signed)
 Mckenzie Mckinney,   Kidney function improved some from 7 months ago. Great news.  LDL improved as well which is great.  TSH is trending hypothyroid but free levels are in normal range. We could increase your thyroid  medication a little. Are you ok with this? Recheck labs in 3 months after dose change.

## 2023-09-01 ENCOUNTER — Telehealth: Payer: Self-pay

## 2023-09-01 ENCOUNTER — Other Ambulatory Visit: Payer: Self-pay | Admitting: Physician Assistant

## 2023-09-01 DIAGNOSIS — I10 Essential (primary) hypertension: Secondary | ICD-10-CM

## 2023-09-01 MED ORDER — VALSARTAN 160 MG PO TABS: 160.0000 mg | ORAL_TABLET | Freq: Every day | ORAL | 0 refills | Status: DC

## 2023-09-01 NOTE — Telephone Encounter (Signed)
 Copied from CRM [id number]. Topic: Clinical - Medication Refill >> Sep 01, 2023  8:35 AM Carrielelia G wrote: Medication: valsartan  (DIOVAN ) 160 MG tablet  Has the patient contacted their pharmacy? Yes (Agent: If no, request that the patient contact the pharmacy for the refill. If patient does not wish to contact the pharmacy document the reason why and proceed with request.) (Agent: If yes, when and what did the pharmacy advise?)  This is the patient's preferred pharmacy:   [id number] - [place], [place] - [address] [place] [address] Phone: [phone] Fax: [phone]  Is this the correct pharmacy for this prescription? Yes If no, delete pharmacy and type the correct one.    Is the patient out of the medication? Yes  Has the patient been seen for an appointment in the last year OR does the patient have an upcoming appointment? Yes  Can we respond through MyChart? Yes  Agent: Please be advised that Rx refills may take up to 3 business days. We ask that you follow-up with your pharmacy.

## 2023-09-01 NOTE — Telephone Encounter (Signed)
 Copied from CRM [id number]. Topic: Clinical - Prescription Issue >> Sep 01, 2023  8:42 AM Madelyne Schiff wrote: Reason for CRM: Patient calling to let provider know that [facility] was a note on the:  levothyroxine  (SYNTHROID ) 100 MCG tablet  That said: Please contact Your Provider    Please advise

## 2023-09-01 NOTE — Telephone Encounter (Signed)
 Patient informed that this was likely because only a 90 day supply was sent to the pharmacy without refills as she will need to return for lab work in 3 months to recheck thyroid  levels before the refill is sent again.  She verbalized understanding. And will return in 3 months for repeat lab work

## 2023-09-21 ENCOUNTER — Ambulatory Visit

## 2023-09-21 DIAGNOSIS — Z1231 Encounter for screening mammogram for malignant neoplasm of breast: Secondary | ICD-10-CM | POA: Diagnosis not present

## 2023-09-26 NOTE — Progress Notes (Signed)
 Normal mammogram. Follow up in 1 year.

## 2023-09-27 ENCOUNTER — Ambulatory Visit: Payer: Self-pay

## 2023-09-27 ENCOUNTER — Ambulatory Visit
Admission: RE | Admit: 2023-09-27 | Discharge: 2023-09-27 | Disposition: A | Discharge status: Home / Self Care | Attending: Family Medicine | Admitting: Family Medicine

## 2023-09-27 VITALS — BP 132/86 | HR 61 | Temp 98.9°F | Resp 18 | Ht 60.0 in | Wt 190.0 lb

## 2023-09-27 DIAGNOSIS — S161XXA Strain of muscle, fascia and tendon at neck level, initial encounter: Secondary | ICD-10-CM | POA: Diagnosis not present

## 2023-09-27 DIAGNOSIS — M542 Cervicalgia: Secondary | ICD-10-CM | POA: Diagnosis not present

## 2023-09-27 DIAGNOSIS — S46911A Strain of unspecified muscle, fascia and tendon at shoulder and upper arm level, right arm, initial encounter: Secondary | ICD-10-CM

## 2023-09-27 DIAGNOSIS — R5383 Other fatigue: Secondary | ICD-10-CM | POA: Diagnosis not present

## 2023-09-27 NOTE — ED Triage Notes (Signed)
 Patient c/o right sided neck/shoulder pain that started last night.  Abdominal pain and foggy feeling, just doesn't feel like herself.  Some nausea, no vomiting, loose stools (eating a lot of black cherries).  Baby ASA x 2 last night.

## 2023-09-27 NOTE — Discharge Instructions (Addendum)
 Advised patient today's EKG was within normal limits and similar in appearance to previous EKG of 04/14/2022.  Advised patient may take OTC Ibuprofen 400 to 800 mg daily, as needed for right shoulder strain.  Encouraged increase daily water intake to 64 ounces per day while taking these medications.  Advised if symptoms worsen and/or unresolved please follow-up with your PCP or here for further evaluation.

## 2023-09-27 NOTE — Telephone Encounter (Signed)
 FYI Only or Action Required?: FYI only for provider.  Patient was last seen in primary care on 08/18/2023 by Antoniette Vermell CROME, PA-C.  Called Nurse Triage reporting Pain.  Symptoms began yesterday.  Interventions attempted: Nothing.  Symptoms are: unchanged.  Triage Disposition: See HCP Within 4 Hours (Or PCP Triage): referred pt to urgent care & secured appt for today at 0930.   Patient/caregiver understands and will follow disposition?: Yes    Copied from CRM [id number]. Topic: Clinical - Red Word Triage >> Sep 27, 2023  8:17 AM Mckenzie Mckinney wrote: Red Word that prompted transfer to Nurse Triage: medicaiton is going her heart rate high and sharp RT  Started 07/16 Reason for Disposition  [1] Unable to use arm at all AND [2] because of shoulder pain or stiffness  [1] Heart beating very rapidly (e.g., > 140 / minute) AND [2] NOT present now  (Exception: Only during exercise.)  Answer Assessment - Initial Assessment Questions 1. DESCRIPTION: Please describe your heart rate or heartbeat that you are having (e.g., fast/slow, regular/irregular, skipped or extra beats, palpitations)   Increase in  HR 2. ONSET: When did it start? (e.g., minutes, hours, days)      Ongoing for a while but worse last night 3. DURATION: How long does it last (e.g., seconds, minutes, hours)     na 4. PATTERN Does it come and go, or has it been constant since it started?  Does it get worse with exertion?   Are you feeling it now?     Comes and goes 5. TAP: Using your hand, can you tap out what you are feeling on a chair or table in front of you, so that I can hear? Note: Not all patients can do this.       na 6. HEART RATE: Can you tell me your heart rate? How many beats in 15 seconds?  Note: Not all patients can do this.       na 7. RECURRENT SYMPTOM: Have you ever had this before? If Yes, ask: When was the last time? and What happened that time?      na 8. CAUSE: What do you think is  causing the palpitations?     unknown 9. CARDIAC HISTORY: Do you have any history of heart disease? (e.g., heart attack, angina, bypass surgery, angioplasty, arrhythmia)      na 10. OTHER SYMPTOMS: Do you have any other symptoms? (e.g., dizziness, chest pain, sweating, difficulty breathing)       Nausea, upset stomach 11. PREGNANCY: Is there any chance you are pregnant? When was your last menstrual period?       na  Answer Assessment - Initial Assessment Questions 1. ONSET: When did the pain start?     yesterday 2. LOCATION: Where is the pain located?     Right neck and shoulder 3. PAIN: How bad is the pain? (Scale 1-10; or mild, moderate, severe)     5 to 6 pain 4. WORK OR EXERCISE: Has there been any recent work or exercise that involved this part of the body?     no 5. CAUSE: What do you think is causing the shoulder pain?     unknown 6. OTHER SYMPTOMS: Do you have any other symptoms? (e.g., neck pain, swelling, rash, fever, numbness, weakness)     Numbness in right leg for 1 yr 7. PREGNANCY: Is there any chance you are pregnant? When was your last menstrual period?     na  Protocols  used: Heart Rate and Heartbeat Questions-A-AH, Shoulder Pain-A-AH

## 2023-09-27 NOTE — Telephone Encounter (Signed)
 The patient has gone to UC for an evaluation to assess their current symptoms.

## 2023-09-27 NOTE — ED Provider Notes (Signed)
 [facility]    CSN: [id number] Arrival date & time: 09/27/23  0910      History   Chief Complaint Chief Complaint  Patient presents with   Neck Pain    HPI Liseth SHUNTEL FISHBURN is a 63 y.o. female.   HPI Very pleasant 63 year old female presents with abdominal pain, loose stools, nausea and feeling foggy.  Patient reports right sided neck/shoulder pain that started last night as well.  Patient reports spending 2-3 hours organizing closet yesterday.  PMH significant for HTN, thyroid  disease, and obesity.  Past Medical History:  Diagnosis Date   Hypertension    Thyroid  disease     Patient Active Problem List   Diagnosis Date Noted   Numbness 12/30/2022   Right calf pain 12/01/2022   Dyspnea 06/03/2022   Class 2 obesity due to excess calories without serious comorbidity with body mass index (BMI) of 36.0 to 36.9 in adult 06/03/2022   Menopause 06/03/2022   Screening for lipid disorders 06/03/2022   Bilateral edema of lower extremity 01/22/2019   Dense breast tissue 01/22/2019   Diverticulosis of large intestine without hemorrhage 01/22/2019   Essential hypertension 01/22/2019   Hx of adenomatous colonic polyps 01/22/2019   Hypercholesteremia 01/22/2019   Hypothyroidism 01/22/2019   Leukopenia 01/22/2019   Obesity (BMI 30-39.9) 01/22/2019   OSA (obstructive sleep apnea) 01/22/2019   Perimenopausal vasomotor symptoms 01/22/2019    Past Surgical History:  Procedure Laterality Date   ABDOMINAL HYSTERECTOMY     CESAREAN SECTION     x 2   TONSILLECTOMY     TOTAL ABDOMINAL HYSTERECTOMY      OB History     Gravida  2   Para  2   Term  2   Preterm      AB      Living  2      SAB      IAB      Ectopic      Multiple      Live Births               Home Medications    Prior to Admission medications   Medication Sig Start Date End Date Taking? Authorizing Provider  fluticasone  (FLONASE ) 50 MCG/ACT nasal spray Place 2 sprays into both  nostrils daily. 06/12/23  Yes Breeback, [name] L, PA-C  hydrochlorothiazide  (HYDRODIURIL ) 12.5 MG tablet Take 1 tablet (12.5 mg total) by mouth daily. 04/20/23  Yes Bevin, Erika S, DO  levothyroxine  (SYNTHROID ) 100 MCG tablet Take 1 tablet (100 mcg total) by mouth daily. 08/23/23  Yes Breeback, [name] L, PA-C  valsartan  (DIOVAN ) 160 MG tablet Take 1 tablet (160 mg total) by mouth daily. 09/01/23  Yes Alvia Bring, DO    Family History Family History  Problem Relation Age of Onset   Alzheimer's disease Father    Diabetes Mellitus II Father    Breast cancer Sister 55   Heart failure Sister    Breast cancer Paternal Aunt    Breast cancer Paternal Aunt    Breast cancer Paternal Aunt     Social History Social History   Tobacco Use   Smoking status: Never   Smokeless tobacco: Never  Vaping Use   Vaping status: Never Used  Substance Use Topics   Alcohol use: Never   Drug use: Never     Allergies   Patient has no known allergies.   Review of Systems Review of Systems  Constitutional:  Positive for fatigue.  Musculoskeletal:  Positive for neck pain.  All other systems reviewed and are negative.    Physical Exam Triage Vital Signs ED Triage Vitals  Encounter Vitals Group     BP      Girls Systolic BP Percentile      Girls Diastolic BP Percentile      Boys Systolic BP Percentile      Boys Diastolic BP Percentile      Pulse      Resp      Temp      Temp src      SpO2      Weight      Height      Head Circumference      Peak Flow      Pain Score      Pain Loc      Pain Education      Exclude from Growth Chart    No data found.  Updated Vital Signs BP 132/86 (BP Location: Left Arm)   Pulse 61   Temp 98.9 F (37.2 C) (Oral)   Resp 18   Ht 5' (1.524 m)   Wt 190 lb (86.2 kg)   SpO2 98%   BMI 37.11 kg/m    Physical Exam Vitals and nursing note reviewed.  Constitutional:      Appearance: Normal appearance. She is well-developed. She is obese. She is not  ill-appearing.  HENT:     Head: Normocephalic and atraumatic.  Eyes:     Extraocular Movements: Extraocular movements intact.     Pupils: Pupils are equal, round, and reactive to light.  Cardiovascular:     Rate and Rhythm: Normal rate and regular rhythm.     Pulses: Normal pulses.     Heart sounds: Normal heart sounds. No murmur heard. Pulmonary:     Effort: Pulmonary effort is normal.     Breath sounds: Normal breath sounds. No wheezing, rhonchi or rales.  Abdominal:     Tenderness: There is no right CVA tenderness or left CVA tenderness.     Hernia: No hernia is present.  Musculoskeletal:        General: Normal range of motion.     Cervical back: Normal range of motion and neck supple.     Comments: Right shoulder: Mild pain elicited with forward flexion/extension  Skin:    General: Skin is warm and dry.  Neurological:     General: No focal deficit present.     Mental Status: She is alert and oriented to person, place, and time.  Psychiatric:        Mood and Affect: Mood normal.        Behavior: Behavior normal.      UC Treatments / Results  Labs (all labs ordered are listed, but only abnormal results are displayed) Labs Reviewed - No data to display  EKG   Radiology No results found.  Procedures Procedures (including critical care time)  Medications Ordered in UC Medications - No data to display  Initial Impression / Assessment and Plan / UC Course  I have reviewed the triage vital signs and the nursing notes.  Pertinent labs & imaging results that were available during my care of the patient were reviewed by me and considered in my medical decision making (see chart for details).    MDM: 1.  Fatigue, unspecified type-EKG revealed sinus bradycardia with nonspecific T wave abnormality, abnormal EKG.  Epic chart reveal reviews similar in appearance to ED visit of 04/14/2022.  2.  Strain of right shoulder, initial encounter-Advised patient today's EKG was within  normal limits and similar in appearance to previous EKG of 04/14/2022.  Advised patient may take OTC Ibuprofen 400 to 800 mg daily, as needed for right shoulder strain.  Encouraged increase daily water intake to 64 ounces per day while taking these medications.  Advised if symptoms worsen and/or unresolved please follow-up with your PCP or here for further evaluation.  Patient discharged home, hemodynamically stable. Final Clinical Impressions(s) / UC Diagnoses   Final diagnoses:  Fatigue, unspecified type  Strain of right shoulder, initial encounter     Discharge Instructions      Advised patient today's EKG was within normal limits and similar in appearance to previous EKG of 04/14/2022.  Advised patient may take OTC Ibuprofen 400 to 800 mg daily, as needed for right shoulder strain.  Encouraged increase daily water intake to 64 ounces per day while taking these medications.  Advised if symptoms worsen and/or unresolved please follow-up with your PCP or here for further evaluation.     ED Prescriptions   None    PDMP not reviewed this encounter.   Teddy Sharper, FNP 09/27/23 1021

## 2023-10-05 ENCOUNTER — Telehealth: Payer: Self-pay

## 2023-10-05 NOTE — Telephone Encounter (Signed)
 Copied from CRM [id number]. Topic: Clinical - Medication Question >> Oct 04, 2023  4:59 PM Graeme ORN wrote: Reason for CRM: Patient called. States would like to leave a message for Latta. She states at her last appt increased levothyroxine  (SYNTHROID ) 100 MCG tablet. She does not want to take this dose. Would like to speak to [name] about what can be done to change it. Even if she just takes 100 MCG twice per week or something. States it makes her tired and makes her heart race. Has previously been triaged for it. Thank You

## 2023-10-06 MED ORDER — LEVOTHYROXINE SODIUM 88 MCG PO TABS
88.0000 ug | ORAL_TABLET | Freq: Every day | ORAL | 1 refills | Status: DC
Start: 2023-10-06 — End: 2023-11-24

## 2023-11-08 ENCOUNTER — Telehealth: Payer: Self-pay

## 2023-11-08 NOTE — Telephone Encounter (Signed)
 Copied from CRM [id number]. Topic: Clinical - Medication Question >> Nov 07, 2023  2:41 PM Kevelyn M wrote: Reason for CRM: Patient is requesting to put lab order for TSH in her Mychart to print off to take to lab site at her job that will draw her labs for free.  Call back#[phone]

## 2023-11-08 NOTE — Telephone Encounter (Signed)
 Ok that is ok. Make sure right lab is chosen. She only wants TSH?

## 2023-11-09 ENCOUNTER — Other Ambulatory Visit: Payer: Self-pay

## 2023-11-09 DIAGNOSIS — E039 Hypothyroidism, unspecified: Secondary | ICD-10-CM

## 2023-11-14 NOTE — Telephone Encounter (Signed)
Labs are in [place].

## 2023-11-14 NOTE — Telephone Encounter (Signed)
 Copied from CRM [id number]. Topic: Clinical - Lab/Test Results >> Nov 14, 2023 10:01 AM Mercer PEDLAR wrote: Reason for CRM: patient had labwork done and is calling to notify PCP and would like callbak or mychart message to let her know if medication will be adjusted.

## 2023-11-23 ENCOUNTER — Other Ambulatory Visit: Payer: Self-pay | Admitting: Physician Assistant

## 2023-11-23 NOTE — Telephone Encounter (Unsigned)
 Copied from CRM #[id number]. Topic: Clinical - Medication Refill >> Nov 23, 2023  4:57 PM Mercer PEDLAR wrote: Medication: levothyroxine  (SYNTHROID ) 88 MCG  Patient stated that she is doing 5 days of and 2 day   Has the patient contacted their pharmacy? Yes (Agent: If no, request that the patient contact the pharmacy for the refill. If patient does not wish to contact the pharmacy document the reason why and proceed with request.) (Agent: If yes, when and what did the pharmacy advise?)  This is the patient's preferred pharmacy:  [id number] - [place], [place] - [address] [place] [address] Phone: [phone] Fax: [phone]  Is this the correct pharmacy for this prescription? Yes If no, delete pharmacy and type the correct one.   Has the prescription been filled recently? No  Is the patient out of the medication? No  Has the patient been seen for an appointment in the last year OR does the patient have an upcoming appointment? Yes  Can we respond through MyChart? No  Agent: Please be advised that Rx refills may take up to 3 business days. We ask that you follow-up with your pharmacy.

## 2023-11-24 MED ORDER — LEVOTHYROXINE SODIUM 88 MCG PO TABS: 88.0000 ug | ORAL_TABLET | Freq: Every day | ORAL | 1 refills | Status: AC

## 2023-11-27 ENCOUNTER — Other Ambulatory Visit: Payer: Self-pay | Admitting: Family Medicine

## 2023-11-27 DIAGNOSIS — I10 Essential (primary) hypertension: Secondary | ICD-10-CM

## 2023-12-01 LAB — HM COLONOSCOPY

## 2023-12-19 ENCOUNTER — Ambulatory Visit: Admitting: Physician Assistant

## 2023-12-19 VITALS — BP 119/78 | HR 63 | Ht 60.0 in | Wt 192.0 lb

## 2023-12-19 DIAGNOSIS — I10 Essential (primary) hypertension: Secondary | ICD-10-CM | POA: Diagnosis not present

## 2023-12-19 DIAGNOSIS — Z6837 Body mass index (BMI) 37.0-37.9, adult: Secondary | ICD-10-CM

## 2023-12-19 DIAGNOSIS — Z79899 Other long term (current) drug therapy: Secondary | ICD-10-CM | POA: Diagnosis not present

## 2023-12-19 DIAGNOSIS — E039 Hypothyroidism, unspecified: Secondary | ICD-10-CM

## 2023-12-19 DIAGNOSIS — E66812 Obesity, class 2: Secondary | ICD-10-CM

## 2023-12-19 DIAGNOSIS — N951 Menopausal and female climacteric states: Secondary | ICD-10-CM

## 2023-12-19 MED ORDER — HYDROCHLOROTHIAZIDE 12.5 MG PO TABS: 12.5000 mg | ORAL_TABLET | Freq: Every day | ORAL | 3 refills | Status: AC

## 2023-12-19 MED ORDER — VALSARTAN 160 MG PO TABS: 160.0000 mg | ORAL_TABLET | Freq: Every day | ORAL | 1 refills | Status: AC

## 2023-12-19 MED ORDER — VEOZAH 45 MG PO TABS: 1.0000 | ORAL_TABLET | Freq: Every day | ORAL | 1 refills | Status: AC

## 2023-12-19 MED ORDER — LEVOTHYROXINE SODIUM 100 MCG PO TABS: ORAL_TABLET | ORAL | Status: AC

## 2023-12-19 NOTE — Progress Notes (Unsigned)
 Established Patient Office Visit  Subjective   Patient ID: Mckenzie Mckinney, female    DOB: [birth year]  Age: 63 y.o. MRN: [id number]  Chief Complaint  Patient presents with   Medical Management of Chronic Issues    HPI Discussed the use of AI scribe software for clinical note transcription with the patient, who gave verbal consent to proceed.  History of Present Illness Mckenzie Mckinney is a 63 year old female who presents with long-standing hot flashes and menopausal symptoms.  Vasomotor symptoms (hot flashes) - Persistent and severe hot flashes for approximately 25 years, beginning in early 52s - Hot flashes cause significant embarrassment and impact mood - Hot flashes disrupt sleep, causing immediate awakening when she becomes hot - No prior treatments attempted for hot flashes  Neuropsychiatric and sleep disturbances - Mood disturbances associated with menopausal symptoms - Sleep disruption secondary to hot flashes - Fatigue present, likely related to poor sleep quality  Weight gain - Weight gain noted in association with menopausal symptoms  Gynecologic surgical history - Partial hysterectomy in 1988 after second childbirth - Ovaries were not removed during the procedure  Thyroid  function and management - Thyroid  function last evaluated in September - Current thyroid  medication regimen: 100 mcg two days per week, 88 mcg on other days     ROS   See HPI.  Objective:     BP 119/78   Pulse 63   Ht 5' (1.524 m)   Wt 192 lb (87.1 kg)   SpO2 100%   BMI 37.50 kg/m  BP Readings from Last 3 Encounters:  12/19/23 119/78  09/27/23 132/86  08/18/23 122/61   Wt Readings from Last 3 Encounters:  12/19/23 192 lb (87.1 kg)  09/27/23 190 lb (86.2 kg)  08/18/23 192 lb (87.1 kg)      Physical Exam Constitutional:      Appearance: Normal appearance. She is obese.  HENT:     Head: Normocephalic.  Cardiovascular:     Rate and Rhythm: Normal rate and regular  rhythm.  Pulmonary:     Effort: Pulmonary effort is normal.     Breath sounds: Normal breath sounds.  Musculoskeletal:     Right lower leg: No edema.     Left lower leg: No edema.  Neurological:     General: No focal deficit present.     Mental Status: She is alert and oriented to person, place, and time.  Psychiatric:        Mood and Affect: Mood normal.      The 10-year ASCVD risk score (Arnett DK, et al., 2019) is: 5.8%    Assessment & Plan:  .Mckenzie Mckinney was seen today for medical management of chronic issues.  Diagnoses and all orders for this visit:  Hot flashes due to menopause -     Fezolinetant (VEOZAH) 45 MG TABS; Take 1 tablet (45 mg total) by mouth daily.  Essential hypertension -     hydrochlorothiazide  (HYDRODIURIL ) 12.5 MG tablet; Take 1 tablet (12.5 mg total) by mouth daily. -     valsartan  (DIOVAN ) 160 MG tablet; Take 1 tablet (160 mg total) by mouth daily. -     CMP14+EGFR  Medication management -     TSH + free T4 -     CMP14+EGFR  Hypothyroidism, unspecified type -     levothyroxine  (SYNTHROID ) 100 MCG tablet; Take one tablet on Tuesday and Thursday. -     TSH + free T4  Class 2 severe obesity due to  excess calories with serious comorbidity and body mass index (BMI) of 37.0 to 37.9 in adult    Assessment & Plan Menopausal symptoms (hot flashes, mood changes, sleep disturbance, weight gain, fatigue) Experiencing menopausal symptoms for 22 years post-partial hysterectomy. Hot flashes significantly impact sleep and mood. Discussed non-hormonal treatments, preferring Veozah for efficacy despite potential liver enzyme elevation. Hormone replacement therapy considered last resort due to associated risks. - Start Veozah for hot flashes. - Monitor liver enzymes at one, two, and three months after starting Veozah. - Consider starting black cohosh and ashwagandha supplements.  Hypothyroidism Managed with levothyroxine . Thyroid  function last checked in  September with dosage adjusted. No new symptoms of dysfunction. - Review thyroid  function tests if due for recheck. Will check in one month.   Obesity - discussed medication options - discussed importance of regular exercise - discussed healthy eating and diet plans  HTN - BP looks great today, continue on same medications     Return in about 3 months (around 03/20/2024).    Matthias Bogus, PA-C

## 2023-12-19 NOTE — Patient Instructions (Signed)
 Get labs checked in 1 month.   Fezolinetant Tablets What is this medication? FEZOLINETANT (FEZ oh LIN e tant) reduces the number and severity of hot flashes due to menopause. It works by blocking substances in your body that cause hot flashes and night sweats. This medicine may be used for other purposes; ask your health care provider or pharmacist if you have questions. COMMON BRAND NAME(S): VEOZAH What should I tell my care team before I take this medication? They need to know if you have any of these conditions: Kidney disease Liver disease An unusual or allergic reaction to fezolinetant, other medications, foods, dyes, or preservatives Pregnant or trying to get pregnant Breastfeeding How should I use this medication? Take this medication by mouth with water. Take it as directed on the prescription label at the same time every day. Do not cut, crush, or chew this medication. Swallow the tablets whole. You can take it with or without food. If it upsets your stomach, take it with food. Keep taking it unless your care team tells you to stop. Talk to your care team about the use of this medication in children. Special care may be needed. Overdosage: If you think you have taken too much of this medicine contact a poison control center or emergency room at once. NOTE: This medicine is only for you. Do not share this medicine with others. What if I miss a dose? If you miss a dose, take it as soon as you can unless it is more than 12 hours late. If it is more than 12 hours late, skip the missed dose. Take the next dose at the normal time. What may interact with this medication? Other medications may affect the way this medication works. Talk with your care team about all of the medications you take. They may suggest changes to your treatment plan to lower the risk of side effects and to make sure your medications work as intended. This list may not describe all possible interactions. Give your  health care provider a list of all the medicines, herbs, non-prescription drugs, or dietary supplements you use. Also tell them if you smoke, drink alcohol, or use illegal drugs. Some items may interact with your medicine. What should I watch for while using this medication? Visit your care team for regular checks on your progress. Tell your care team if your symptoms do not start to get better or if they get worse. You may need blood work while taking this medication. What side effects may I notice from receiving this medication? Side effects that you should report to your care team as soon as possible: Allergic reactions--skin rash, itching, hives, swelling of the face, lips, tongue, or throat Liver injury--right upper belly pain, loss of appetite, nausea, light-colored stool, dark yellow or brown urine, yellowing skin or eyes, unusual weakness or fatigue Side effects that usually do not require medical attention (report these to your care team if they continue or are bothersome): Back pain Diarrhea Hot flashes Stomach pain Trouble sleeping This list may not describe all possible side effects. Call your doctor for medical advice about side effects. You may report side effects to FDA at 1-800-FDA-1088. Where should I keep my medication? Keep out of the reach of children and pets. Store at room temperature between 20 and 25 degrees C (68 and 77 degrees F). Get rid of any unused medication after the expiration date. To get rid of medications that are no longer needed or have expired: Take the  medication to a medication take-back program. Check with your pharmacy or law enforcement to find a location. If you cannot return the medication, check the label or package insert to see if the medication should be thrown out in the garbage or flushed down the toilet. If you are not sure, ask your care team. If it is safe to put it in the trash, take the medication out of the container. Mix the medication  with cat litter, dirt, coffee grounds, or other unwanted substance. Seal the mixture in a bag or container. Put it in the trash. NOTE: This sheet is a summary. It may not cover all possible information. If you have questions about this medicine, talk to your doctor, pharmacist, or health care provider.  2024 Elsevier/Gold Standard (2021-08-05 00:00:00)

## 2023-12-20 ENCOUNTER — Ambulatory Visit: Payer: Self-pay | Admitting: Physician Assistant

## 2023-12-20 ENCOUNTER — Encounter: Payer: Self-pay | Admitting: Physician Assistant

## 2023-12-20 LAB — TSH+FREE T4
Free T4: 1.33 ng/dL (ref 0.82–1.77)
TSH: 2.12 u[IU]/mL (ref 0.450–4.500)

## 2023-12-20 LAB — CMP14+EGFR
ALT: 16 IU/L (ref 0–32)
AST: 18 IU/L (ref 0–40)
Albumin: 4.7 g/dL (ref 3.9–4.9)
Alkaline Phosphatase: 91 IU/L (ref 49–135)
BUN/Creatinine Ratio: 13 (ref 12–28)
BUN: 13 mg/dL (ref 8–27)
Bilirubin Total: 0.4 mg/dL (ref 0.0–1.2)
CO2: 21 mmol/L (ref 20–29)
Calcium: 9.7 mg/dL (ref 8.7–10.3)
Chloride: 104 mmol/L (ref 96–106)
Creatinine, Ser: 1.04 mg/dL — ABNORMAL HIGH (ref 0.57–1.00)
Globulin, Total: 2.5 g/dL (ref 1.5–4.5)
Glucose: 97 mg/dL (ref 70–99)
Potassium: 3.7 mmol/L (ref 3.5–5.2)
Sodium: 141 mmol/L (ref 134–144)
Total Protein: 7.2 g/dL (ref 6.0–8.5)
eGFR: 61 mL/min/1.73 (ref >59)

## 2023-12-20 NOTE — Progress Notes (Signed)
 Thyroid  looks perfect. Kidney function stable. Liver looks great.

## 2024-02-14 ENCOUNTER — Other Ambulatory Visit: Payer: Self-pay

## 2024-02-14 ENCOUNTER — Ambulatory Visit: Admitting: Physician Assistant

## 2024-02-14 ENCOUNTER — Telehealth: Payer: Self-pay | Admitting: Physician Assistant

## 2024-02-14 ENCOUNTER — Encounter: Payer: Self-pay | Admitting: Physician Assistant

## 2024-02-14 VITALS — BP 120/78 | HR 76 | Ht 60.0 in | Wt 190.0 lb

## 2024-02-14 DIAGNOSIS — Z79899 Other long term (current) drug therapy: Secondary | ICD-10-CM

## 2024-02-14 DIAGNOSIS — R0981 Nasal congestion: Secondary | ICD-10-CM

## 2024-02-14 DIAGNOSIS — N951 Menopausal and female climacteric states: Secondary | ICD-10-CM | POA: Diagnosis not present

## 2024-02-14 DIAGNOSIS — J029 Acute pharyngitis, unspecified: Secondary | ICD-10-CM

## 2024-02-14 DIAGNOSIS — I10 Essential (primary) hypertension: Secondary | ICD-10-CM

## 2024-02-14 NOTE — Patient Instructions (Addendum)
 Get labs checked at 2 months and 3 months. No appt just labs.   Upper Respiratory Infection, Adult An upper respiratory infection (URI) affects the nose, throat, and upper airways that lead to the lungs. The most common type of URI is often called the common cold. URIs usually get better on their own, without medical treatment. What are the causes? A URI is caused by a germ (virus). You may catch these germs by: Breathing in droplets from an infected person's cough or sneeze. Touching something that has the germ on it (is contaminated) and then touching your mouth, nose, or eyes. What increases the risk? You are more likely to get a URI if: You are very young or very old. You have close contact with others, such as at work, school, or a health care facility. You smoke. You have long-term (chronic) heart or lung disease. You have a weakened disease-fighting system (immune system). You have nasal allergies or asthma. You have a lot of stress. You have poor nutrition. What are the signs or symptoms? Runny or stuffy (congested) nose. Cough. Sneezing. Sore throat. Headache. Feeling tired (fatigue). Fever. Not wanting to eat as much as usual. Pain in your forehead, behind your eyes, and over your cheekbones (sinus pain). Muscle aches. Redness or irritation of the eyes. Pressure in the ears or face. How is this treated? URIs usually get better on their own within 7-10 days. Medicines cannot cure URIs, but your doctor may recommend certain medicines to help relieve symptoms, such as: Over-the-counter cold medicines. Medicines to reduce coughing (cough suppressants). Coughing is a type of defense against infection that helps to clear the nose, throat, windpipe, and lungs (respiratory system). Take these medicines only as told by your doctor. Medicines to lower your fever. Follow these instructions at home: Activity Rest as needed. If you have a fever, stay home from work or school until  your fever is gone, or until your doctor says you may return to work or school. You should stay home until you cannot spread the infection anymore (you are not contagious). Your doctor may have you wear a face mask so you have less risk of spreading the infection. Relieving symptoms Rinse your mouth often with salt water. To make salt water, dissolve -1 tsp (3-6 g) of salt in 1 cup (237 mL) of warm water. Use a cool-mist humidifier to add moisture to the air. This can help you breathe more easily. Eating and drinking  Drink enough fluid to keep your pee (urine) pale yellow. Eat soups and other clear broths. General instructions  Take over-the-counter and prescription medicines only as told by your doctor. Do not smoke or use any products that contain nicotine or tobacco. If you need help quitting, ask your doctor. Avoid being where people are smoking (avoid secondhand smoke). Stay up to date on all your shots (immunizations), and get the flu shot every year. Keep all follow-up visits. How to prevent the spread of infection to others  Wash your hands with soap and water for at least 20 seconds. If you cannot use soap and water, use hand sanitizer. Avoid touching your mouth, face, eyes, or nose. Cough or sneeze into a tissue or your sleeve or elbow. Do not cough or sneeze into your hand or into the air. Contact a doctor if: You are getting worse, not better. You have any of these: A fever or chills. Brown or red mucus in your nose. Yellow or brown fluid (discharge)coming from your nose. Pain  in your face, especially when you bend forward. Swollen neck glands. Pain when you swallow. White areas in the back of your throat. Get help right away if: You have shortness of breath that gets worse. You have very bad or constant: Headache. Ear pain. Pain in your forehead, behind your eyes, and over your cheekbones (sinus pain). Chest pain. You have long-lasting (chronic) lung disease  along with any of these: Making high-pitched whistling sounds when you breathe, most often when you breathe out (wheezing). Long-lasting cough (more than 14 days). Coughing up blood. A change in your usual mucus. You have a stiff neck. You have changes in your: Vision. Hearing. Thinking. Mood. These symptoms may be an emergency. Get help right away. Call 911. Do not wait to see if the symptoms will go away. Do not drive yourself to the hospital. Summary An upper respiratory infection (URI) is caused by a germ (virus). The most common type of URI is often called the common cold. URIs usually get better within 7-10 days. Take over-the-counter and prescription medicines only as told by your doctor. This information is not intended to replace advice given to you by your health care provider. Make sure you discuss any questions you have with your health care provider. Document Revised: 09/30/2020 Document Reviewed: 09/30/2020 Elsevier Patient Education  2024 [employer].

## 2024-02-14 NOTE — Telephone Encounter (Signed)
 Orders placed in patient chart and note sent to patient via Mychart  with this information .

## 2024-02-14 NOTE — Telephone Encounter (Signed)
 Could you please put in lab orders for patient to have them done at work  Order for 2 months Order for 3 months

## 2024-02-14 NOTE — Progress Notes (Signed)
 Established Patient Office Visit  Subjective   Patient ID: Mckenzie Mckinney, female    DOB: [birth year]  Age: 63 y.o. MRN: [id number]  Chief Complaint  Patient presents with   Medical Management of Chronic Issues    HPI .SABRADiscussed the use of AI scribe software for clinical note transcription with the patient, who gave verbal consent to proceed.  History of Present Illness Mckenzie Mckinney is a 63 year old female with hypertension, menopause-related hot flashes, and hypothyroidism who presents for a six-month follow-up.  Menopausal vasomotor symptoms - Hot flashes have improved significantly with Veozah , with a reported reduction of 'ninety-nine point nine percent'.  Hypothyroidism management - Synthroid  regimen consists of 100 mcg on Tuesdays and Thursdays. - Adheres to prescribed dosing schedule.  Pharyngitis symptoms - Sore throat onset the previous night. - No medication taken for sore throat except sinus relief tea. - Suspected exposure to illness from recent contact with someone who may have had allergies or illness. - No chest pain or shortness of breath. - Feels 'fine, sleepy'.  Hypertension management - Hypertension is a chronic condition under ongoing management.   ROS See HPI.    Objective:     BP 120/78   Pulse 76   Ht 5' (1.524 m)   Wt 190 lb (86.2 kg)   SpO2 99%   BMI 37.11 kg/m  BP Readings from Last 3 Encounters:  02/14/24 120/78  12/19/23 119/78  09/27/23 132/86   Wt Readings from Last 3 Encounters:  02/14/24 190 lb (86.2 kg)  12/19/23 192 lb (87.1 kg)  09/27/23 190 lb (86.2 kg)      Physical Exam Constitutional:      Appearance: Normal appearance.  HENT:     Head: Normocephalic.     Right Ear: Tympanic membrane, ear canal and external ear normal. There is no impacted cerumen.     Left Ear: Tympanic membrane, ear canal and external ear normal. There is no impacted cerumen.     Nose: Nose normal.     Mouth/Throat:     Mouth: Mucous  membranes are moist.  Eyes:     Extraocular Movements: Extraocular movements intact.     Conjunctiva/sclera: Conjunctivae normal.     Pupils: Pupils are equal, round, and reactive to light.  Cardiovascular:     Rate and Rhythm: Normal rate and regular rhythm.     Pulses: Normal pulses.     Heart sounds: Normal heart sounds.  Pulmonary:     Effort: Pulmonary effort is normal.  Musculoskeletal:     Cervical back: Normal range of motion and neck supple. Tenderness present.     Right lower leg: No edema.     Left lower leg: No edema.  Lymphadenopathy:     Cervical: Cervical adenopathy present.  Neurological:     General: No focal deficit present.     Mental Status: She is alert.  Psychiatric:        Mood and Affect: Mood normal.      The 10-year ASCVD risk score (Arnett DK, et al., 2019) is: 5.9%    Assessment & Plan:  SABRASABRADorine was seen today for medical management of chronic issues.  Diagnoses and all orders for this visit:  Hot flashes due to menopause -     CMP14+EGFR  Sore throat  Sinus congestion  Medication management -     CMP14+EGFR  Essential hypertension   Assessment & Plan Hypertension Blood pressure controlled at 120/78 mmHg with hydrochlorothiazide  and valsartan . -  Continue hydrochlorothiazide  and valsartan .  Menopausal symptoms (hot flashes) Hot flashes reduced by 99.9% with Veozah . Family history of breast cancer precludes hormone therapy. - Continue Veozah . - Check liver enzymes at 1 month( today), 2 and 3 months.  - Orders pended.  - Tolerating well.  Hypothyroidism Managed with Synthroid  100 mcg on Tuesday and Thursday. Thyroid  function tests current as of October. - Continue Synthroid  100 mcg on Tuesday and Thursday.  Acute pharyngitis and nasal congestion Sore throat and nasal congestion likely from recent exposure. No chest pain or shortness of breath. Ears red but not painful. Negative for COVID/FLU/Strep - Discussed symptomatic care  for symptoms.     Dax Murguia, PA-C

## 2024-02-14 NOTE — Telephone Encounter (Signed)
 Hepatic function panel for future draw 1 month from now and 2 months from now, please.

## 2024-02-15 LAB — CMP14+EGFR
ALT: 17 IU/L (ref 0–32)
AST: 25 IU/L (ref 0–40)
Albumin: 4.8 g/dL (ref 3.9–4.9)
Alkaline Phosphatase: 77 IU/L (ref 49–135)
BUN/Creatinine Ratio: 9 — ABNORMAL LOW (ref 12–28)
BUN: 10 mg/dL (ref 8–27)
Bilirubin Total: 0.7 mg/dL (ref 0.0–1.2)
CO2: 21 mmol/L (ref 20–29)
Calcium: 10 mg/dL (ref 8.7–10.3)
Chloride: 102 mmol/L (ref 96–106)
Creatinine, Ser: 1.08 mg/dL — ABNORMAL HIGH (ref 0.57–1.00)
Globulin, Total: 2.4 g/dL (ref 1.5–4.5)
Glucose: 85 mg/dL (ref 70–99)
Potassium: 4.1 mmol/L (ref 3.5–5.2)
Sodium: 139 mmol/L (ref 134–144)
Total Protein: 7.2 g/dL (ref 6.0–8.5)
eGFR: 58 mL/min/1.73 — ABNORMAL LOW (ref >59)

## 2024-02-16 ENCOUNTER — Ambulatory Visit: Payer: Self-pay | Admitting: Physician Assistant

## 2024-02-16 NOTE — Progress Notes (Signed)
 Liver enzymes look great.  Kidney function drop just a little but did put you in a different kidney function stage. We will recheck again in one month. There are chronic kidney disease medications that can be protectant for further decline that I am more than happy to discuss with you. Keeping BP controlled, staying hydrated and avoid anti-inflammatories are the best treatment for now.

## 2024-02-16 NOTE — Progress Notes (Signed)
 It would be good to have at least a telephone visit or virtual to answer her questions and go over medication options etc. I would want another GFR below 60 before starting. So lets recheck in 1 month when we recheck liver to make sure it does not go back up.

## 2024-02-19 ENCOUNTER — Encounter: Payer: Self-pay | Admitting: Physician Assistant

## 2024-02-19 ENCOUNTER — Telehealth: Admitting: Physician Assistant

## 2024-02-19 DIAGNOSIS — I10 Essential (primary) hypertension: Secondary | ICD-10-CM

## 2024-02-19 DIAGNOSIS — J329 Chronic sinusitis, unspecified: Secondary | ICD-10-CM

## 2024-02-19 DIAGNOSIS — J40 Bronchitis, not specified as acute or chronic: Secondary | ICD-10-CM

## 2024-02-19 DIAGNOSIS — N182 Chronic kidney disease, stage 2 (mild): Secondary | ICD-10-CM | POA: Insufficient documentation

## 2024-02-19 DIAGNOSIS — N951 Menopausal and female climacteric states: Secondary | ICD-10-CM

## 2024-02-19 MED ORDER — AMOXICILLIN-POT CLAVULANATE 875-125 MG PO TABS: 1.0000 | ORAL_TABLET | Freq: Two times a day (BID) | ORAL | 0 refills | Status: AC

## 2024-02-19 MED ORDER — PROMETHAZINE-DM 6.25-15 MG/5ML PO SYRP: 5.0000 mL | ORAL_SOLUTION | Freq: Four times a day (QID) | ORAL | 0 refills | Status: AC | PRN

## 2024-02-19 NOTE — Progress Notes (Unsigned)
 ..Virtual Visit via Video Note  I connected with Mckenzie Mckinney on 02/20/24 at  2:40 PM EST by a video enabled telemedicine application and verified that I am speaking with the correct person using two identifiers.  Location: Patient: home Provider: clinic  .SABRAParticipating in visit:  Patient: Mckenzie Mckinney Provider: Vermell Bologna PA-C   I discussed the limitations of evaluation and management by telemedicine and the availability of in person appointments. The patient expressed understanding and agreed to proceed.  History of Present Illness: SABRASABRADiscussed the use of AI scribe software for clinical note transcription with the patient, who gave verbal consent to proceed.  History of Present Illness Mckenzie Mckinney is a 63 year old female with hypertension who presents for follow-up on kidney function and management of an upper respiratory infection.  Renal function monitoring - GFR has fluctuated over the past year, with values ranging from 60 to 65. - Most recent GFR was 58, measured five days ago. - GFR was 67 one year ago. - Maintains hydration and avoids regular use of anti-inflammatory medications to protect kidney function. - Recently discontinued new vitamins and Veozah  due to concerns about renal impact.  Hypertension management - Hypertension is controlled with medication. - No history of diabetes.  Upper respiratory infection symptoms - Onset of symptoms began Tuesday night into Wednesday. - Significant fatigue, with prolonged sleep from Wednesday to Friday. - Improvement in symptoms over the weekend, followed by relapse with sneezing and sinus congestion. - Using prescribed cough syrup, which is effective for symptom management.      Observations/Objective: No acute distress Normal mood and appearance Normal breathing  Assessment and Plan: SABRASABRADiagnoses and all orders for this visit:  CKD (chronic kidney disease) stage 2, GFR 60-89 ml/min  Hot flashes due to  menopause  Essential hypertension  Sinobronchitis -     promethazine -dextromethorphan (PROMETHAZINE -DM) 6.25-15 MG/5ML syrup; Take 5 mLs by mouth 4 (four) times daily as needed. -     amoxicillin -clavulanate (AUGMENTIN ) 875-125 MG tablet; Take 1 tablet by mouth 2 (two) times daily.   Assessment & Plan Chronic kidney disease stage 2 GFR fluctuating between 58 and 67. Recent upper respiratory infection may have contributed to decreased GFR. Discussed potential use of SGLT2 inhibitors like Farxiga if GFR remains below 60. Mckenzie Mckinney has shown to stabilize kidney function in patients with GFR <60 for up to 12 years. Main side effect is potential for yeast infections due to increased urination of glucose. - Recheck GFR in one month. - Ensure adequate hydration. - Avoid regular use of anti-inflammatory medications. - Monitor blood pressure regularly. - Discuss potential use of SGLT2 inhibitors if GFR remains below 60.  Essential hypertension Hypertension is controlled with valsartan , which also provides kidney protection. - Continue valsartan  for blood pressure control and kidney protection. - Goal BP is under 130/80.   Sinobronchitis Symptoms persisting for over a week, indicating possible bacterial infection. - Prescribed Augmentin  for sino-bronchitis infection. - Refilled cough syrup for symptom relief.  Menopausal symptoms Currently managed with Veozah , which is not affecting kidney function. Discussed potential impact of ashwagandha and black girl vitamins on kidney function, though no direct link established. - Resume Veozah . - Consider discontinuing ashwagandha and black girl vitamins and then rechecking kidney function.     Follow Up Instructions:    I discussed the assessment and treatment plan with the patient. The patient was provided an opportunity to ask questions and all were answered. The patient agreed with the plan and demonstrated an understanding  of the instructions.    The patient was advised to call back or seek an in-person evaluation if the symptoms worsen or if the condition fails to improve as anticipated.   Nature [name], PA-C

## 2024-02-20 ENCOUNTER — Encounter: Payer: Self-pay | Admitting: Physician Assistant

## 2024-02-23 ENCOUNTER — Ambulatory Visit: Admitting: Physician Assistant

## 2024-03-19 ENCOUNTER — Telehealth: Payer: Self-pay

## 2024-03-19 NOTE — Telephone Encounter (Signed)
 Copied from CRM [id number]. Topic: General - Other >> Mar 19, 2024 11:04 AM Shanda MATSU wrote: Reason for CRM: Patient called in wanting to know why she is being seen for appt scheduled on 03/20/2024, adv patient that appt is a follow up visit from 12/19/23 visit, patient wants to know if she still needs to be seen on 03/20/24 if she has seen provider after 12/19/2023, is req a call back.

## 2024-03-19 NOTE — Telephone Encounter (Signed)
 Patient wanted to be sure that Mckenzie Bologna PA had received the lab work drawn on 12/29/225. Her new job now allows her to have lab work drawn for free with her company . She just wanted to be sure that [name] did receive them and that after she reviews them nothing is needed to be addressed.

## 2024-03-19 NOTE — Telephone Encounter (Signed)
 Cancelled appt for 03/20/2024 and patient has been scheduled for 06/26/2024.

## 2024-03-19 NOTE — Telephone Encounter (Signed)
 Yes 12/29 labs look GREAT. No concerns.

## 2024-03-20 ENCOUNTER — Ambulatory Visit: Admitting: Physician Assistant

## 2024-03-20 NOTE — Telephone Encounter (Signed)
 Patient informed of results.

## 2024-06-26 ENCOUNTER — Ambulatory Visit: Admitting: Physician Assistant
